# Patient Record
Sex: Male | Born: 1965 | Race: White | Hispanic: No | Marital: Married | Smoking: Never smoker
Health system: Southern US, Community
[De-identification: ages and names within clinical notes are randomized; demographics above are authoritative.]

## PROBLEM LIST (undated history)

## (undated) DIAGNOSIS — C801 Malignant (primary) neoplasm, unspecified: Secondary | ICD-10-CM

---

## 1999-03-29 ENCOUNTER — Inpatient Hospital Stay (HOSPITAL_COMMUNITY): Admission: RE | Admit: 1999-03-29 | Discharge: 1999-03-30 | Payer: Self-pay | Admitting: Otolaryngology

## 2006-02-14 ENCOUNTER — Ambulatory Visit: Payer: Self-pay | Admitting: Oncology

## 2007-10-07 IMAGING — CT CT NECK W/O CM
2 series · 10 of 14 positions shown, 12 images · IV contrast (CONTRAST)
Comparison: NONE

CLINICAL DATA: Question mass or enlarged lymph node right 
submandibular  area. 

CT SOFT TISSUE NECK WITHOUT AND WITH INTRAVENOUS CONTRAST
TECHNIQUE: Pre and post-contrast images with a BB marking the 
area.

[Series 2: without · axial · non-contrast · 0.49mm/px · z∈[-201,-76]mm · 3 of 51 slices shown]
[im 13/51  bone]
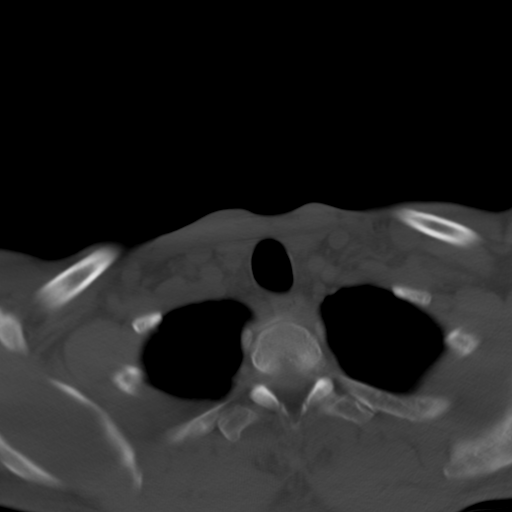
[im 26/51  bone]
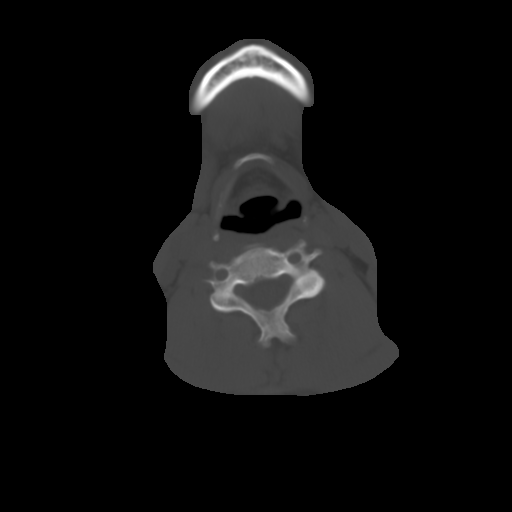
[im 38/51  bone]
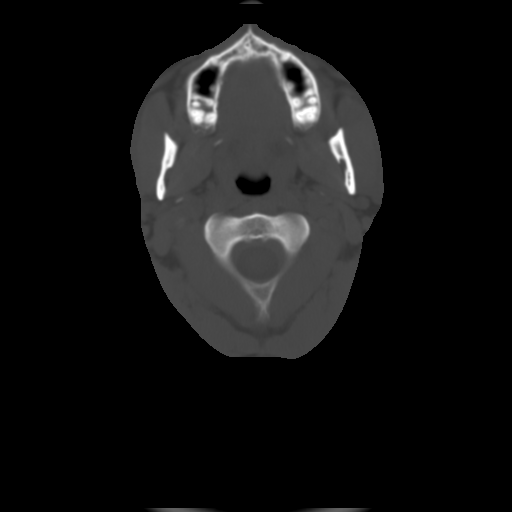

[Series 3: with contrast · axial · 0.49mm/px · z∈[-231,-42]mm · 7 of 85 slices shown, 9 images]
[im 11/85  soft-tissue]
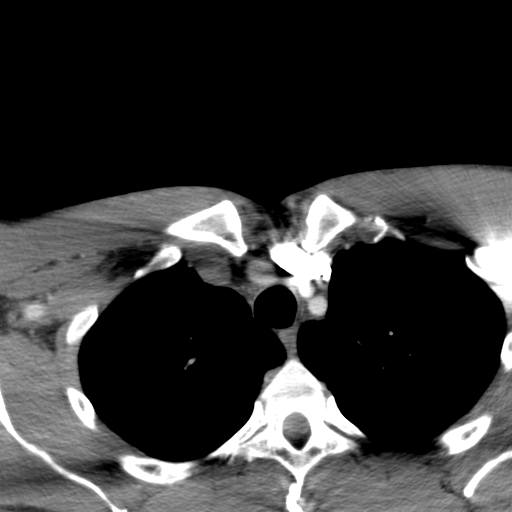
[im 11/85  bone]
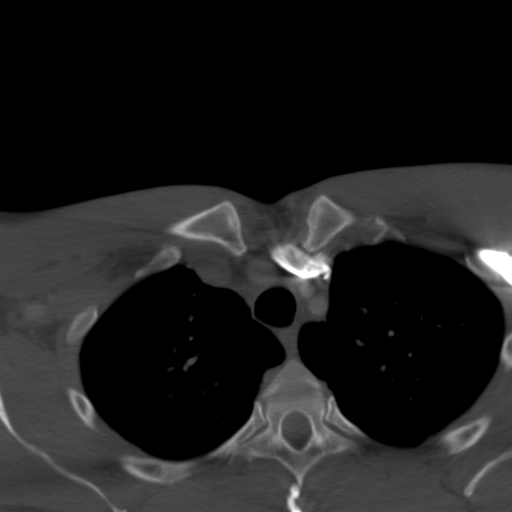
[im 22/85  bone]
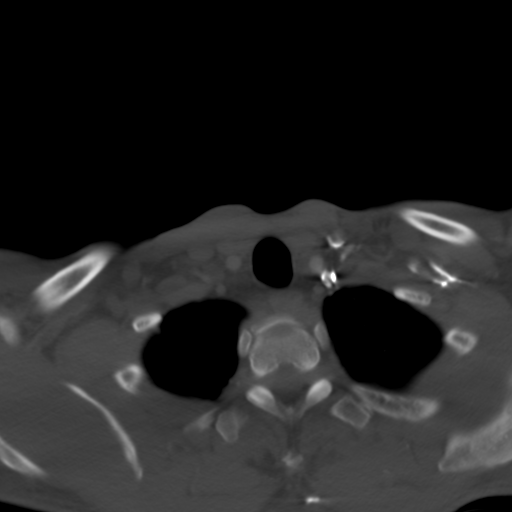
[im 32/85  bone]
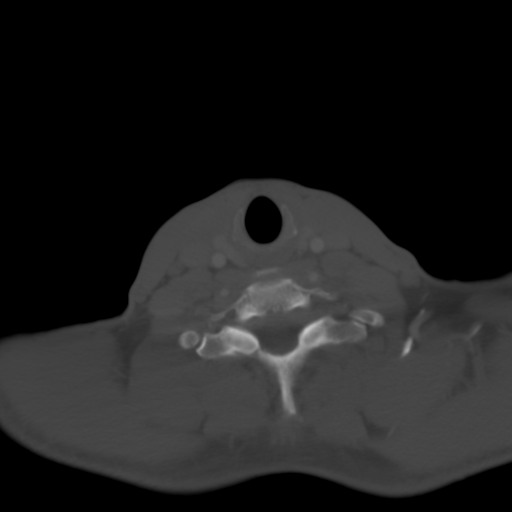
[im 43/85  bone]
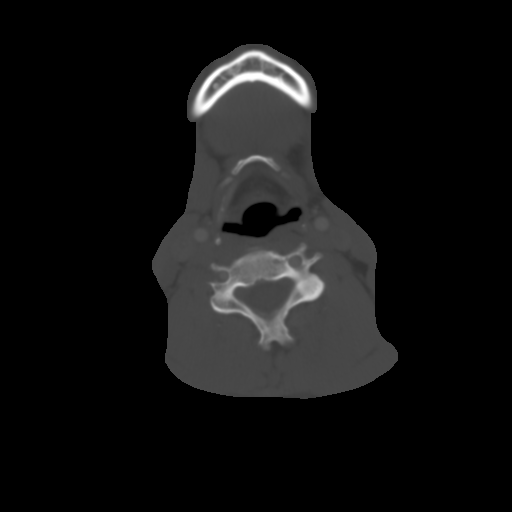
[im 53/85  soft-tissue]
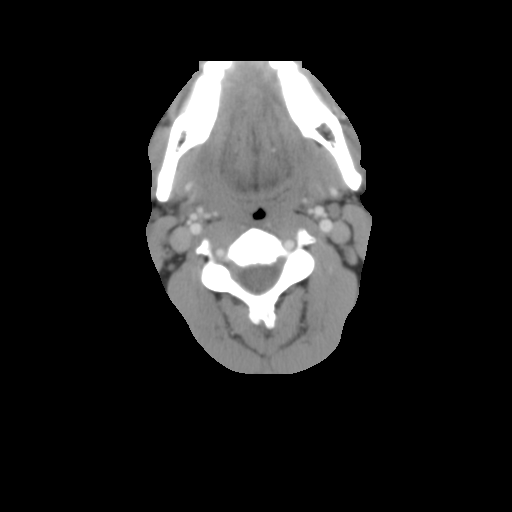
[im 53/85  bone]
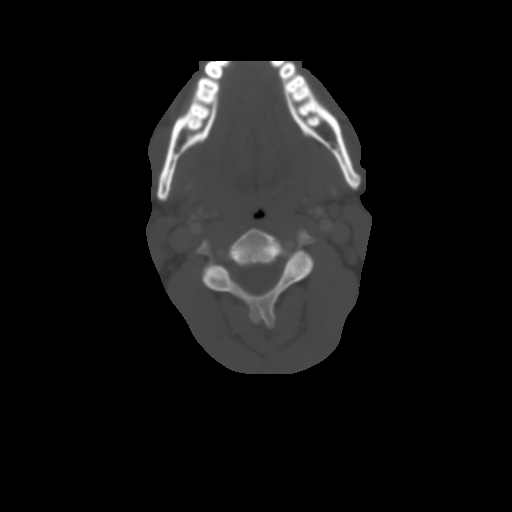
[im 64/85  bone]
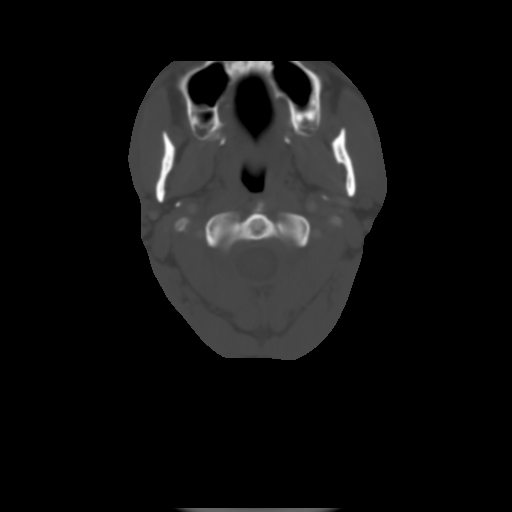
[im 74/85  bone]
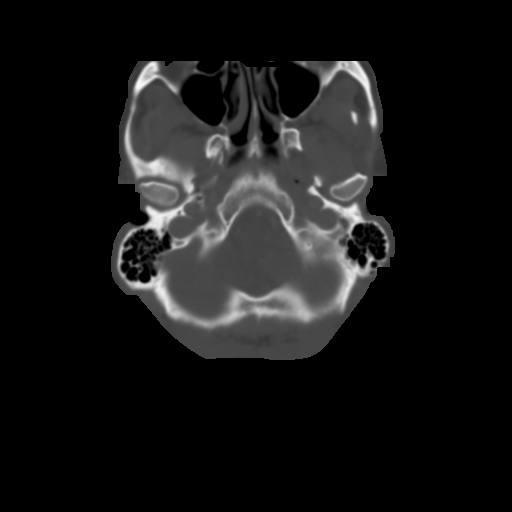

[10 of 14 positions shown; findings below may reference images not displayed]

FINDINGS: The area marked overlies the right submandibular gland. 
 I do not see a distinct mass within or adjacent to the gland, 
which is just a little larger than the opposite side.  The patient 
is rather thin, and there is very little fat separating structures 
in the neck.  No vascular abnormality is detected.  Parapharyngeal 
soft tissues, thyroid, larynx, and airway are unremarkable.
IMPRESSION: Asymmetric prominence of the right submandibular 
gland relative to the left.  No discrete mass visible by CT.  
Ultrasound or MRI may provide further differentiation if 
clinically warranted. Youllanda Aikens, M.D. Electronically 
[facility]  [facility]

## 2010-09-04 ENCOUNTER — Encounter: Payer: Self-pay | Admitting: Family Medicine

## 2010-09-04 ENCOUNTER — Ambulatory Visit
Admission: RE | Admit: 2010-09-04 | Discharge: 2010-09-04 | Disposition: A | Discharge status: Home / Self Care | Payer: BC Managed Care – PPO | Source: Ambulatory Visit | Attending: Family Medicine | Admitting: Family Medicine

## 2010-09-04 ENCOUNTER — Other Ambulatory Visit: Payer: Self-pay | Admitting: Family Medicine

## 2010-09-04 ENCOUNTER — Inpatient Hospital Stay (INDEPENDENT_AMBULATORY_CARE_PROVIDER_SITE_OTHER)
Admission: RE | Admit: 2010-09-04 | Discharge: 2010-09-04 | Disposition: A | Discharge status: Home / Self Care | Payer: BC Managed Care – PPO | Source: Ambulatory Visit | Attending: Family Medicine | Admitting: Family Medicine

## 2010-09-04 DIAGNOSIS — R059 Cough, unspecified: Secondary | ICD-10-CM

## 2010-09-04 DIAGNOSIS — R05 Cough: Secondary | ICD-10-CM

## 2010-09-04 DIAGNOSIS — E1065 Type 1 diabetes mellitus with hyperglycemia: Secondary | ICD-10-CM | POA: Insufficient documentation

## 2010-09-04 DIAGNOSIS — E109 Type 1 diabetes mellitus without complications: Secondary | ICD-10-CM | POA: Insufficient documentation

## 2010-09-04 DIAGNOSIS — J189 Pneumonia, unspecified organism: Secondary | ICD-10-CM

## 2010-09-06 ENCOUNTER — Telehealth (INDEPENDENT_AMBULATORY_CARE_PROVIDER_SITE_OTHER): Payer: Self-pay | Admitting: Emergency Medicine

## 2011-01-10 ENCOUNTER — Inpatient Hospital Stay (INDEPENDENT_AMBULATORY_CARE_PROVIDER_SITE_OTHER)
Admission: RE | Admit: 2011-01-10 | Discharge: 2011-01-10 | Disposition: A | Discharge status: Home / Self Care | Payer: BC Managed Care – PPO | Source: Ambulatory Visit | Attending: Family Medicine | Admitting: Family Medicine

## 2011-01-10 ENCOUNTER — Encounter: Payer: Self-pay | Admitting: Family Medicine

## 2011-01-10 DIAGNOSIS — L259 Unspecified contact dermatitis, unspecified cause: Secondary | ICD-10-CM

## 2011-03-26 NOTE — Letter (Signed)
Summary: Out of Work  [facility]  [address]   [place], [place] [address]   Phone: [phone]  Fax: [phone]    Sep 04, 2010   Employee:  JWAN HORNBAKER    To Whom It May Concern:   For Medical reasons, please excuse the above named employee from work today and tomorrow.   If you need additional information, please feel free to contact our office.         Sincerely,    Donna Christen MD

## 2011-03-26 NOTE — Progress Notes (Signed)
Summary: SEVERE COUGH/FEVER/FATIGUE rm 5   Vital Signs:  Patient Profile:   45 Years Old Male CC:      cough and fatigue x 1wk Height:     72 inches Weight:      183.25 pounds O2 Sat:      98 % O2 treatment:    Room Air Temp:     99.2 degrees F oral Pulse rate:   106 / minute Resp:     18 per minute BP sitting:   120 / 82  (left arm) Cuff size:   regular  Vitals Entered By: Clemens Catholic LPN (Sep 04, 2010 10:49 AM)                  Updated Prior Medication List: HUMALOG 100 UNIT/ML SOLN (INSULIN LISPRO (HUMAN))  insulin pump  Current Allergies: No known allergies History of Present Illness Chief Complaint: cough and fatigue x 1wk History of Present Illness:  Subjective:  Patient has diabetes and a past history of non-hodgkins lymphoma.  He has been fatigued over the past several weeks and visited his oncologist 1.5 weeks ago where a repeat CBC was stable (WBC 6.1, platelets 140, Hgb normal).  Over the past week he has developed a non-productive cough, worse at night.  He has had no sore throat and minimal sinus congestion.  He has had a persistent low grade fever every afternoon to about 100.6.  He sometimes coughs until he gags.  No pleuritic pain or shortness of breath.  No GI or GU symptoms.  He is not sure about his last tetanus shot.  REVIEW OF SYSTEMS Constitutional Symptoms       Complains of fever, chills, night sweats, and fatigue.     Denies weight loss and weight gain.  Eyes       Denies change in vision, eye pain, eye discharge, glasses, contact lenses, and eye surgery. Ear/Nose/Throat/Mouth       Denies hearing loss/aids, change in hearing, ear pain, ear discharge, dizziness, frequent runny nose, frequent nose bleeds, sinus problems, sore throat, hoarseness, and tooth pain or bleeding.  Respiratory       Complains of dry cough.      Denies productive cough, wheezing, shortness of breath, asthma, bronchitis, and emphysema/COPD.  Cardiovascular       Denies  murmurs, chest pain, and tires easily with exhertion.    Gastrointestinal       Denies stomach pain, nausea/vomiting, diarrhea, constipation, blood in bowel movements, and indigestion. Genitourniary       Denies painful urination, kidney stones, and loss of urinary control. Neurological       Denies paralysis, seizures, and fainting/blackouts. Musculoskeletal       Denies muscle pain, joint pain, joint stiffness, decreased range of motion, redness, swelling, muscle weakness, and gout.  Skin       Denies bruising, unusual mles/lumps or sores, and hair/skin or nail changes.  Psych       Denies mood changes, temper/anger issues, anxiety/stress, speech problems, depression, and sleep problems. Other Comments: pt c/o fatigue, dry cough and fever (in the PM, 99.5-100.6) x 1wk. he has taken Nyquil, codiene cough syrup, and tylenol which seems to help with fever and night time cough. he saw his oncologist when the fatigue started and had a CBC drawn which according to him was normal.    Past History:  Past Medical History: non-hodgkins lymphoma 2007 Diabetes mellitus, type I (insulin pump)  Past Surgical History: lumpectomy in his neck  2007  Family History: none  Social History: Never Smoked Alcohol use-no Drug use-no Smoking Status:  never Drug Use:  no   Objective:  Appearance:  Patient appears healthy, stated age, and in no acute distress  Eyes:  Pupils are equal, round, and reactive to light and accomodation.  Extraocular movement is intact.  Conjunctivae are not inflamed.  Ears:  Canals normal.  Tympanic membranes normal.   Nose:  Mildly congested turbinates.  No sinus tenderness  Pharynx:  Normal  Neck:  Supple.  No adenopathy is present.  Lungs:   Few rhonchi right posterior base; no wheezes or rales.  Breath sounds are equal.  Heart:  Regular rate and rhythm without murmurs, rubs, or gallops.  Abdomen:  Nontender without masses or hepatosplenomegaly.  Bowel sounds are  present.  No CVA or flank tenderness.  Extremities:  No edema.  Pedal pulses are full and equal.  CBC:  WBC 8.3; Ly 15.0, MO 8.9, GR 76.1; Hgb 15.3; plates 629 Chest X-ray:  1.  Vague patchy parenchymal opacity in the periphery of the right mid lung suspicious for a small focus of pneumonia. 2.  Peribronchial thickening suggestive of bronchitis. Assessment New Problems: PNEUMONIA (ICD-486) COUGH (ICD-786.2) DIABETES MELLITUS, TYPE I (ICD-250.01)  ? PERTUSSIS OR ATYPICAL PNEUMONIA  Plan New Medications/Changes: BENZONATATE 200 MG CAPS (BENZONATATE) One by mouth hs as needed cough  #12 x 0, 09/04/2010, Donna Christen MD CLARITHROMYCIN 500 MG TABS (CLARITHROMYCIN) One Tab by mouth two times a day  #20 x 0, 09/04/2010, Donna Christen MD  New Orders: T-Chest x-ray, 2 views [71020] CBC w/Diff [52841-32440] Pulse Oximetry (single measurment) [94760] New Patient Level IV [99204] Planning Comments:   Begin Biaxin, expectorant daytime, cough suppressant at bedtime.  Increase fluid intake Followup with PCP in 10 to 14 days for repeat Chest X-ray.  Recommend Tdap when well.  Return for worsening symptoms   The patient and/or caregiver has been counseled thoroughly with regard to medications prescribed including dosage, schedule, interactions, rationale for use, and possible side effects and they verbalize understanding.  Diagnoses and expected course of recovery discussed and will return if not improved as expected or if the condition worsens. Patient and/or caregiver verbalized understanding.  Prescriptions: BENZONATATE 200 MG CAPS (BENZONATATE) One by mouth hs as needed cough  #12 x 0   Entered and Authorized by:   Donna Christen MD   Signed by:   Donna Christen MD on 09/04/2010   Method used:   Print then Give to Patient   RxID:   [id number] CLARITHROMYCIN 500 MG TABS (CLARITHROMYCIN) One Tab by mouth two times a day  #20 x 0   Entered and Authorized by:   Donna Christen MD   Signed by:    Donna Christen MD on 09/04/2010   Method used:   Print then Give to Patient   RxID:   [id number]   Patient Instructions: 1)  Take Mucinex  (guaifenesin) twice daily for congestion. 2)  Increase fluid intake, rest. 3)  Recommend Tdap when well. 4)  Followup with family doctor in 10 to 14 days (earlier if symptoms become worse).  Orders Added: 1)  T-Chest x-ray, 2 views [71020] 2)  CBC w/Diff [[id number]] 3)  Pulse Oximetry (single measurment) [94760] 4)  New Patient Level IV [01601]

## 2011-03-26 NOTE — Telephone Encounter (Signed)
  Phone Note Outgoing Call   Call placed by: Emilio Math,  Sep 06, 2010 7:25 PM Call placed to: Patient Summary of Call: Feeling better going back to work tomorrow

## 2011-03-26 NOTE — Progress Notes (Signed)
Summary: Bites on back Room 4   Vital Signs:  Patient Profile:   45 Years Old Male CC:      Rash, red and itchy on ankles x 4wks, back, arms, trunk 2 wks-pt went hunting 2 wks ago rash worse after Height:     72 inches (182.88 cm) Weight:      194 pounds (88.18 kg) O2 Sat:      99 % O2 treatment:    Room Air Temp:     98.4 degrees F (36.89 degrees C) oral Pulse rate:   64 / minute Pulse rhythm:   regular Resp:     16 per minute BP sitting:   120 / 77  (left arm) Cuff size:   regular  Vitals Entered By: Emilio Math (January 10, 2011 1:24 PM)                  Current Allergies: No known allergies History of Present Illness Chief Complaint: Rash, red and itchy on ankles x 4wks, back, arms, trunk 2 wks-pt went hunting 2 wks ago rash worse after History of Present Illness:  Subjective:  Patient complains of developing small pruritic bumps on his trunk and extremities about 2 weeks ago.  He feels well otherwise; no systemic symptoms.  No other family members with similar rash.  Current Meds HUMALOG 100 UNIT/ML SOLN (INSULIN LISPRO (HUMAN))  CEPHALEXIN 500 MG CAPS (CEPHALEXIN) One by mouth two times a day STROMECTOL 3 MG TABS (IVERMECTIN) Take 6 tabs by mouth as a single dose.  Take on empty stomach with plenty of water.  REVIEW OF SYSTEMS Constitutional Symptoms      Denies fever, chills, night sweats, weight loss, weight gain, and fatigue.  Eyes       Denies change in vision, eye pain, eye discharge, glasses, contact lenses, and eye surgery. Ear/Nose/Throat/Mouth       Denies hearing loss/aids, change in hearing, ear pain, ear discharge, dizziness, frequent runny nose, frequent nose bleeds, sinus problems, sore throat, hoarseness, and tooth pain or bleeding.  Respiratory       Denies dry cough, productive cough, wheezing, shortness of breath, asthma, bronchitis, and emphysema/COPD.  Cardiovascular       Denies murmurs, chest pain, and tires easily with exhertion.     Gastrointestinal       Denies stomach pain, nausea/vomiting, diarrhea, constipation, blood in bowel movements, and indigestion. Genitourniary       Denies painful urination, kidney stones, and loss of urinary control. Neurological       Denies paralysis, seizures, and fainting/blackouts. Musculoskeletal       Denies muscle pain, joint pain, joint stiffness, decreased range of motion, redness, swelling, muscle weakness, and gout.  Skin       Denies bruising, unusual mles/lumps or sores, and hair/skin or nail changes.  Psych       Denies mood changes, temper/anger issues, anxiety/stress, speech problems, depression, and sleep problems.  Past History:  Past Medical History: Reviewed history from 09/04/2010 and no changes required. non-hodgkins lymphoma 2007 Diabetes mellitus, type I (insulin pump)  Past Surgical History: Reviewed history from 09/04/2010 and no changes required. lumpectomy in his neck 2007  Family History: Reviewed history from 09/04/2010 and no changes required. 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  No pustules or vesicles.  On the left medial thigh are several small lesions in a linear pattern.  No lesions on palms  Assessment New Problems: DERMATITIS (ICD-692.9)  SUSPECT FOLLICULITIS.  MUST ALSO CONSIDER SCABIES  Plan New Medications/Changes: STROMECTOL 3 MG TABS (IVERMECTIN) Take 6 tabs by mouth as a single dose.  Take on empty stomach with plenty of water.  #6 x 1, 01/10/2011, Donna Christen MD CEPHALEXIN 500 MG CAPS (CEPHALEXIN) One by mouth two times a day  #14 x 0, 01/10/2011, Donna Christen MD  New Orders: Est. Patient Level III [phone] Planning Comments:   Patient wishes to avoid topical treatment for  scabies. Begin Keflex 500mg  two times a day.  If not improved one week, begin Ivermectin (one dose, may repeat in 2 to 4 weeks).   If not improving 3 to 4 weeks, follow-up with dermatologist.                    The patient and/or caregiver has been counseled thoroughly with regard to medications prescribed including dosage, schedule, interactions, rationale for use, and possible side effects and they verbalize understanding.  Diagnoses and expected course of recovery discussed and will return if not improved as expected or if the condition worsens. Patient and/or caregiver verbalized understanding.  Prescriptions: STROMECTOL 3 MG TABS (IVERMECTIN) Take 6 tabs by mouth as a single dose.  Take on empty stomach with plenty of water.  #6 x 1   Entered and Authorized by:   Donna Christen MD   Signed by:   Donna Christen MD on 01/10/2011   Method used:   Print then Give to Patient   RxID:   [id number] CEPHALEXIN 500 MG CAPS (CEPHALEXIN) One by mouth two times a day  #14 x 0   Entered and Authorized by:   Donna Christen MD   Signed by:   Donna Christen MD on 01/10/2011   Method used:   Print then Give to Patient   RxID:   [id number]   Orders Added: 1)  Est. Patient Level III [01027]

## 2011-12-31 ENCOUNTER — Encounter: Payer: Self-pay | Admitting: Emergency Medicine

## 2011-12-31 ENCOUNTER — Emergency Department
Admission: EM | Admit: 2011-12-31 | Discharge: 2011-12-31 | Disposition: A | Discharge status: Home / Self Care | Payer: BC Managed Care – PPO | Source: Home / Self Care

## 2011-12-31 DIAGNOSIS — R21 Rash and other nonspecific skin eruption: Secondary | ICD-10-CM

## 2011-12-31 HISTORY — DX: Malignant (primary) neoplasm, unspecified: C80.1

## 2011-12-31 LAB — POCT CBC W AUTO DIFF (K'VILLE URGENT CARE)

## 2011-12-31 MED ORDER — DOXYCYCLINE HYCLATE 100 MG PO CAPS: 100.0000 mg | ORAL_CAPSULE | Freq: Two times a day (BID) | ORAL | Status: AC

## 2011-12-31 NOTE — ED Notes (Signed)
Rash, red itchy bumps on legs, trunk, arms.  Clearing land 2 weeks ago had a tick bite on left leg, fever about one week later, 3 days ago little red, itchy bumps popped up. Had same thing about one year ago.

## 2011-12-31 NOTE — ED Provider Notes (Signed)
History     CSN: [id number]  Arrival date & time 12/31/11  1051   First MD Initiated Contact with Patient 12/31/11 1119      Chief Complaint  Patient presents with  . Rash   HPI HPI  This patient complains of a RASH  Location: upper and lower extremities, trunk  Onset: 3-4 days  Course: Pt went hunting in [place], had multiple insect and tick bites. Patient states that he's had persistent skin irritation since this point. Patient also reports having a tick bite approximately 2 weeks ago. Patient states he's had intermittent fevers chills and malaise since this point. Patient states he was previously seen for a very similar rash at this time one year ago. Patient was placed on Keflex for this with resolution of symptoms. Patient also had a prescription of doxycycline that was given to him by dermatologist. Patient states he's been using this medication for the past couple of days. Patient is unsure if medication has been working because is been in a very hot place over the summer. Patient also has a baseline history of insulin-dependent diabetes. Patient states A1c's have been in the sixes. Patient also has a history of non-Hodgkin's lymphoma that was treated with chemotherapy approximately 6 years ago. Patient is currently in remission.   Self-treated with: old doxycyline prescription   Improvement with treatment: unsure   History  Itching: yes  Tenderness: no  New medications/antibiotics: yes; doxy at onset of itching   Pet exposure: no  Recent travel or tropical exposure: yes  New soaps, shampoos, detergent, clothing: no  Tick/insect exposure: yes  Red Flags  Feeling ill: mild; now resolved per pt.   Fever: mild at home   Facial/tongue swelling/difficulty breathing: no  Diabetic or immunocompromised: yes     Past Medical History  Diagnosis Date  . Diabetes mellitus   . Cancer     History reviewed. No pertinent past surgical history.  No family history on  file.  History  Substance Use Topics  . Smoking status: Never Smoker   . Smokeless tobacco: Not on file  . Alcohol Use: No      Review of Systems  All other systems reviewed and are negative.    Allergies  Review of patient's allergies indicates no known allergies.  Home Medications   Current Outpatient Rx  Name Route Sig Dispense Refill  . ACETAMINOPHEN 325 MG PO TABS Oral Take 650 mg by mouth every 6 (six) hours as needed.    Marland Kitchen FOLIC ACID 1 MG PO TABS Oral Take 1 mg by mouth daily.    . INSULIN ASPART 100 UNIT/ML [place] SOLN Subcutaneous Inject into the skin 3 (three) times daily before meals.      BP 116/78  Pulse 67  Temp 98 F (36.7 C) (Oral)  Resp 18  Ht 6' (1.829 m)  Wt 192 lb (87.091 kg)  BMI 26.04 kg/m2  SpO2 99%  Physical Exam  Constitutional: He appears well-developed and well-nourished.  HENT:  Head: Normocephalic and atraumatic.  Right Ear: External ear normal.  Left Ear: External ear normal.  Eyes: Conjunctivae are normal. Pupils are equal, round, and reactive to light.  Neck: Normal range of motion. Neck supple.  Cardiovascular: Normal rate and regular rhythm.   Pulmonary/Chest: Effort normal and breath sounds normal.  Abdominal: Soft. Bowel sounds are normal.  Musculoskeletal: Normal range of motion.  Lymphadenopathy:    He has no cervical adenopathy.  Skin:  ED Course  Procedures (including critical care time)  Labs Reviewed - No data to display No results found.   1. Rash       MDM  Suspect this may be a recurrence of patient's last flare of folliculitis. However, given underlying diabetes and tick exposure we'll cover with doxycycline. Will check CBC, RMSF titers. Restart antibacterial soap. Given rash distribution, scabies is also on ddx. If sxs persist despite treatment, will start on ivermectin.  Discuss infectious red flags with patient for reevaluation including fevers, chills, worsening rash. Otherwise plan for  followup in one week.    The patient and/or caregiver has been counseled thoroughly with regard to treatment plan and/or medications prescribed including dosage, schedule, interactions, rationale for use, and possible side effects and they verbalize understanding. Diagnoses and expected course of recovery discussed and will return if not improved as expected or if the condition worsens. Patient and/or caregiver verbalized understanding.             Doree Albee, MD 12/31/11 1154

## 2012-01-02 LAB — ROCKY MTN SPOTTED FVR AB, IGG-BLOOD: RMSF IgG: 0.25 IV

## 2012-07-21 IMAGING — CR DG CHEST 2V
2 series · 2 of 2 positions shown · non-contrast
Comparison: None.

CLINICAL DATA: Persistent cough and fever

CHEST - 2 VIEW

[view not recorded (1 of 2)]
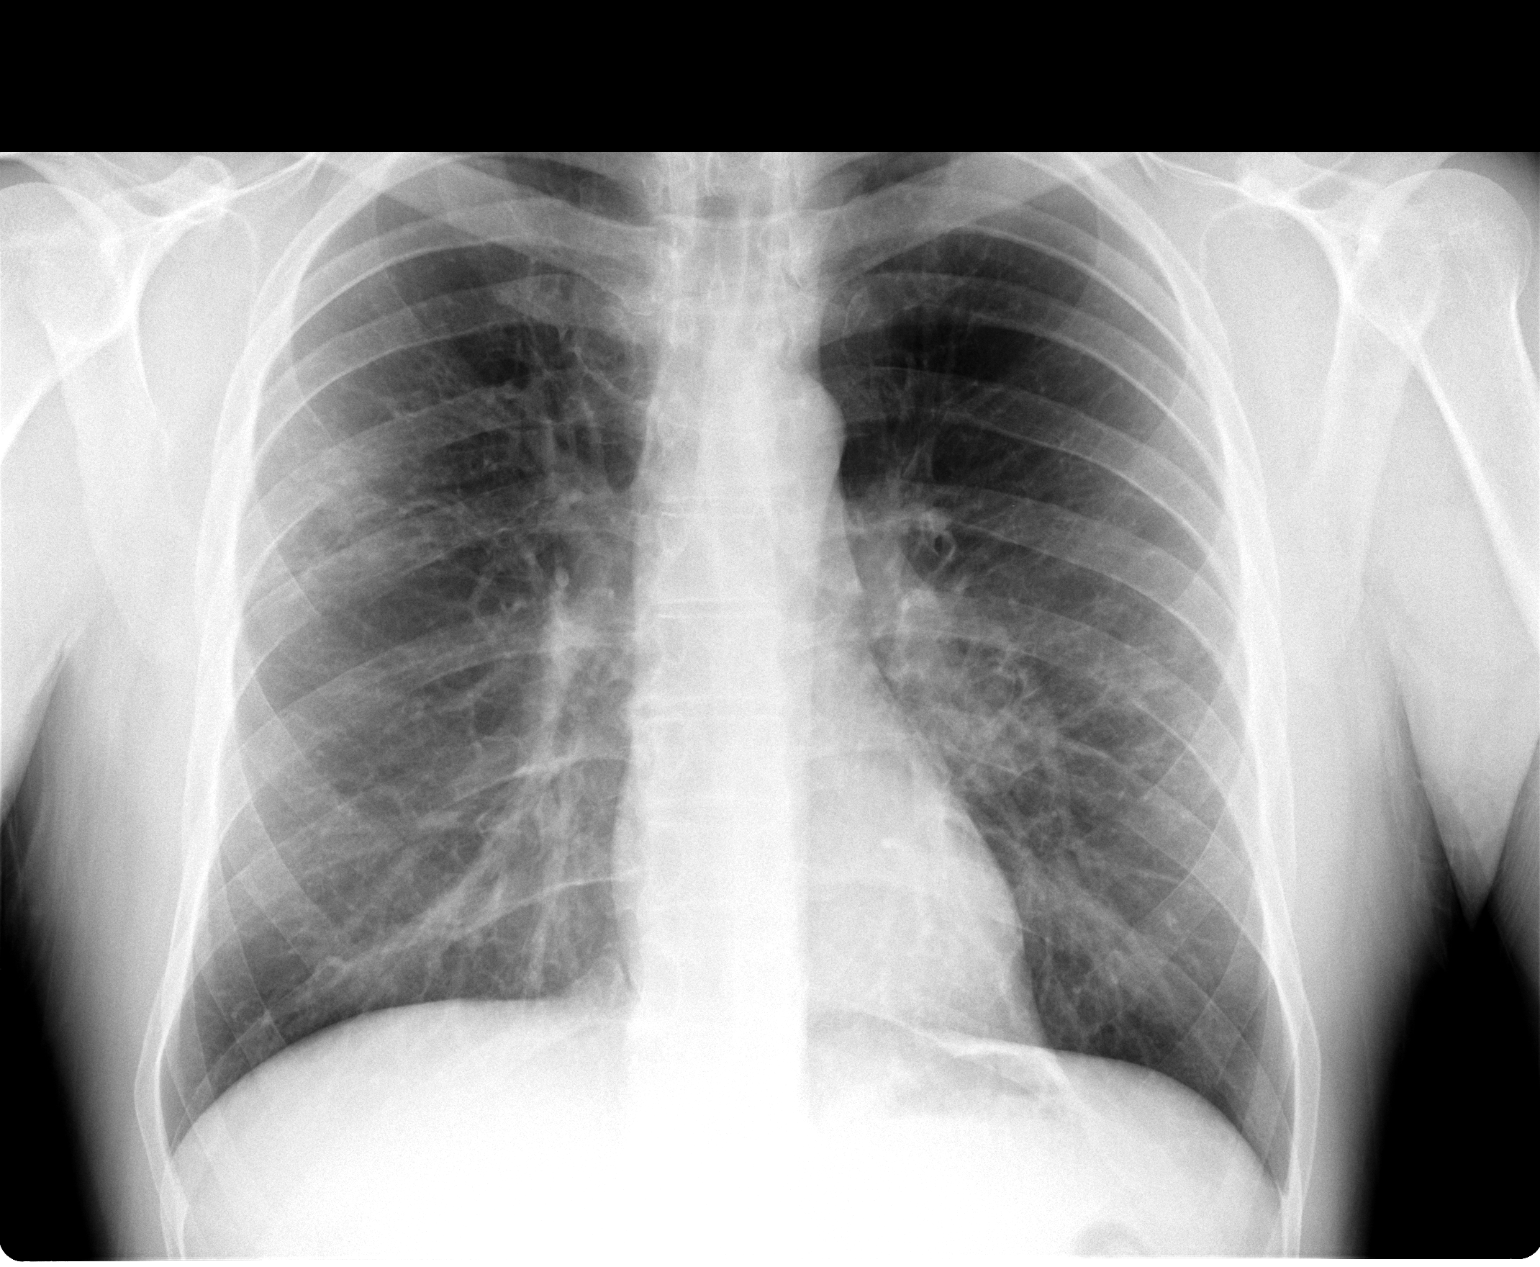

[view not recorded (2 of 2)]
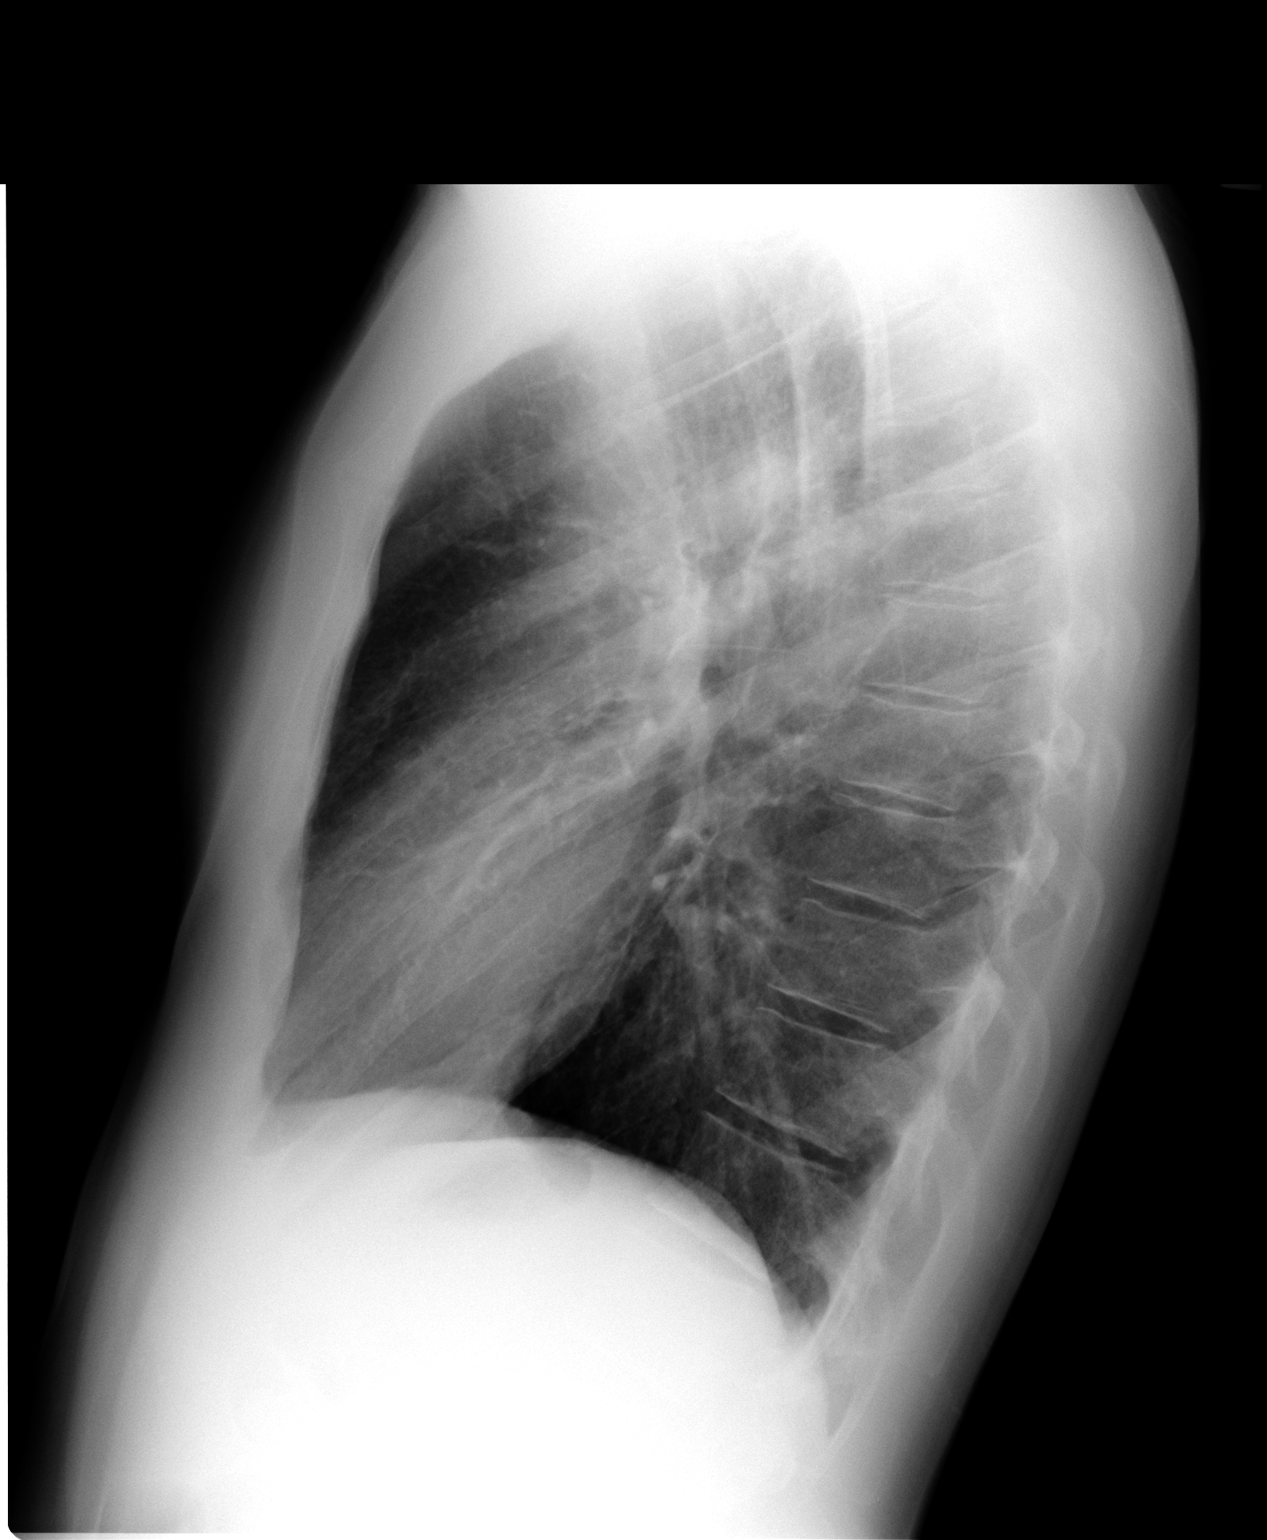

[2 of 2 positions shown; findings below may reference images not displayed]

FINDINGS: There is minimal patchy opacity in the periphery of the
right mid lung field probably within the right upper lobe
suspicious for a small focus of pneumonia.  Mild peribronchial
thickening is noted which may indicate bronchitis as well.
Mediastinal contours appear stable.  The heart is within normal
limits in size.  No bony abnormality is seen.
IMPRESSION: 1.  Vague patchy parenchymal opacity in the periphery of the right
mid lung suspicious for a small focus of pneumonia.
2.  Peribronchial thickening suggestive of bronchitis.

## 2012-10-27 ENCOUNTER — Other Ambulatory Visit: Payer: Self-pay | Admitting: Endocrinology

## 2012-10-27 DIAGNOSIS — C8589 Other specified types of non-Hodgkin lymphoma, extranodal and solid organ sites: Secondary | ICD-10-CM

## 2012-10-27 DIAGNOSIS — E1065 Type 1 diabetes mellitus with hyperglycemia: Secondary | ICD-10-CM

## 2012-11-04 ENCOUNTER — Encounter: Payer: Self-pay | Admitting: Endocrinology

## 2012-11-04 ENCOUNTER — Ambulatory Visit (INDEPENDENT_AMBULATORY_CARE_PROVIDER_SITE_OTHER): Payer: BC Managed Care – PPO | Admitting: Endocrinology

## 2012-11-04 VITALS — BP 126/68 | HR 69 | Temp 97.9°F | Resp 12 | Ht 73.5 in | Wt 194.2 lb

## 2012-11-04 DIAGNOSIS — E109 Type 1 diabetes mellitus without complications: Secondary | ICD-10-CM

## 2012-11-04 NOTE — Patient Instructions (Addendum)
Dual wave bolus for high fat meals with extra units also Get Bayer meter  To change basal rates: 1.0 from 11 PM-2 AM and 0.9 from 2 AM-5 AM

## 2012-11-04 NOTE — Progress Notes (Signed)
Patient ID: Corey Hudson, male   DOB: 05/07/1965, 47 y.o.   MRN: [id number]  Insulin Pump followup:   CURRENT brand:  Medtronic  HISTORY: He has a long-standing type 1 diabetes which is well-controlled  The pump SETTINGS are: Basal rate: 0.9 until 2 AM, 2 AM = 0.8, 11 AM = 0.7, 4 PM = 0.65, 8 PM = 0.80 and 11 PM = 0.90  Carb Ratio: 1: 8 of breakfast and then 1:12 Correction factor 1: 40 with target 90-110 and active insulin 5 hours  GLUCOSE CONTROL with the pump is assessed today by download.   His blood sugars are mostly near-normal during the day but fasting average 109, lunchtime average 94 and suppertime average 108. Recent readings not available because of pump malfunction but otherwise fasting range has been 79-132, lunchtime as low as 70 and suppertime range 70-166. However overnight average blood sugar is about 200, highest 248 at about 4 AM. Overall average glucose 131 Blood sugar analysis limited by the fact that he is not entering on his glucose readings in his pump from his One Touch meter, this does not link to his pump He is doing 48-month-old boluses and about 4 bolus wizard events per day with 79% using correction and rewind about every 4 days  HYPERGLYCEMIA Occuring at about 2 AM and fairly consistently after midnight until 5-6 AM. Only rarely has a high reading in the afternoon or evening, usually below 200  HYPOGLYCEMIC episodes are minimal but he does not always record his low readings on his pump. No severe low episodes and able to prevent low sugars with physical activity by getting the temporary basal to a low basal rate  A1c on 10/27/12 = 5.8, previous values not available  Compared to the last visit the diabetes is about the same although higher blood sugars at night and less hypoglycemia evident Because of his insulin pump malfunction about 2 days ago he started using Lantus 18 units at night, his blood sugar has been somewhat variable, higher this morning because of  eating pizza  EXERCISE: He is usually active on weekends with various outdoor activities in the summer  MICROALBUMIN has been tested, and the result is normal .     Medication List       This list is accurate as of: 11/04/12 11:59 AM.  Always use your most recent med list.               acetaminophen 325 MG tablet  Commonly known as:  TYLENOL  Take 650 mg by mouth every 6 (six) hours as needed.     folic acid 1 MG tablet  Commonly known as:  FOLVITE  Take 1 mg by mouth daily.     insulin lispro 100 UNIT/ML injection  Commonly known as:  HUMALOG  Inject into the skin 3 (three) times daily before meals.        Allergies: No Known Allergies  Past Medical History  Diagnosis Date  . Diabetes mellitus   . Cancer     No past surgical history on file.  No family history on file.    Social History:  reports that he has never smoked. He does not have any smokeless tobacco history on file. He reports that he does not drink alcohol. His drug history is not on file.  REVIEW of systems:  No history of hypertension: No history of hypothyroidism HYPERLIPIDEMIA: His LDL is 111 with HDL 53 as of 10/27/12  ASSESSMENT:  Type 1 diabetes, well controlled  Problems identified:  Not entering in all his blood sugars in the pump since he does not link his meter, discussed getting the Bayer meter from the accompany to help link his blood sugars to the pump   Nocturnal hyperglycemia  is evident on his pump download. Discussed that this is related to inadequate basal rather than late-night snacks And basal rates would be increased overnight until 5 AM  Not doing any dual wave boluses or extra insulin for higher fat meals. However he is usually eating low fat meals Occasional hypoglycemia but readings this is infrequent Very active lifestyle and he had a problem with pump malfunction possibly from water exposure. He would like to use Lantus occasionally on weekends instead of the pump  and discussed how to do this with about 18 units; may reduce the dosage he is planning to be active. He can do the boluses with the NovoLog using the syringe that he has used Review download on next visit. A1c is normal and  would be concerned about potentially excessive hypoglycemia causing the lower value; will change his target to 100-110 instead of 90-100  His new pump was programmed by myself for the original settings along with new basal rate changes which would be 1.0 from 11 PM-2 AM and 0.9 from 2 AM-5 AM, he is not able to program basal rates and bolus as on his own Copies of previous settings also given  Counseling time over 50% of today's 25 minute visit

## 2013-02-04 ENCOUNTER — Ambulatory Visit: Payer: BC Managed Care – PPO | Admitting: Endocrinology

## 2013-03-25 ENCOUNTER — Other Ambulatory Visit (INDEPENDENT_AMBULATORY_CARE_PROVIDER_SITE_OTHER): Payer: BC Managed Care – PPO

## 2013-03-25 DIAGNOSIS — E109 Type 1 diabetes mellitus without complications: Secondary | ICD-10-CM

## 2013-03-25 DIAGNOSIS — IMO0002 Reserved for concepts with insufficient information to code with codable children: Secondary | ICD-10-CM

## 2013-03-25 DIAGNOSIS — C8589 Other specified types of non-Hodgkin lymphoma, extranodal and solid organ sites: Secondary | ICD-10-CM

## 2013-03-25 DIAGNOSIS — E1065 Type 1 diabetes mellitus with hyperglycemia: Secondary | ICD-10-CM

## 2013-03-25 LAB — CBC WITH DIFFERENTIAL/PLATELET
Basophils Relative: 1 % (ref 0.0–3.0)
HCT: 44.2 % (ref 39.0–52.0)
Hemoglobin: 14.6 g/dL (ref 13.0–17.0)
Lymphocytes Relative: 18.1 % (ref 12.0–46.0)
Monocytes Relative: 10.8 % (ref 3.0–12.0)
Neutro Abs: 4.5 10*3/uL (ref 1.4–7.7)
RBC: 5.02 Mil/uL (ref 4.22–5.81)

## 2013-03-25 LAB — BASIC METABOLIC PANEL
CO2: 31 mEq/L (ref 19–32)
Calcium: 9.3 mg/dL (ref 8.4–10.5)
GFR: 87.75 mL/min (ref >60.00)
Potassium: 4.4 mEq/L (ref 3.5–5.1)
Sodium: 137 mEq/L (ref 135–145)

## 2013-03-25 LAB — URINALYSIS, ROUTINE W REFLEX MICROSCOPIC
Leukocytes, UA: NEGATIVE
Nitrite: NEGATIVE
RBC / HPF: NONE SEEN (ref 0–?)
Specific Gravity, Urine: 1.015 (ref 1.000–1.030)
pH: 8 (ref 5.0–8.0)

## 2013-03-25 LAB — HEMOGLOBIN A1C: Hgb A1c MFr Bld: 6.6 % — ABNORMAL HIGH (ref 4.6–6.5)

## 2013-03-25 LAB — MICROALBUMIN / CREATININE URINE RATIO: Microalb Creat Ratio: 0.2 mg/g (ref 0.0–30.0)

## 2013-03-27 ENCOUNTER — Ambulatory Visit (INDEPENDENT_AMBULATORY_CARE_PROVIDER_SITE_OTHER): Payer: BC Managed Care – PPO | Admitting: Endocrinology

## 2013-03-27 ENCOUNTER — Encounter: Payer: Self-pay | Admitting: Endocrinology

## 2013-03-27 VITALS — BP 112/68 | HR 71 | Temp 98.2°F | Resp 12 | Ht 73.0 in | Wt 197.4 lb

## 2013-03-27 DIAGNOSIS — IMO0002 Reserved for concepts with insufficient information to code with codable children: Secondary | ICD-10-CM

## 2013-03-27 DIAGNOSIS — E1065 Type 1 diabetes mellitus with hyperglycemia: Secondary | ICD-10-CM

## 2013-03-27 DIAGNOSIS — D696 Thrombocytopenia, unspecified: Secondary | ICD-10-CM

## 2013-03-27 DIAGNOSIS — E7800 Pure hypercholesterolemia, unspecified: Secondary | ICD-10-CM

## 2013-03-27 NOTE — Progress Notes (Signed)
Patient ID: Corey Hudson, male   DOB: [birth year], 47 y.o.   MRN: [id number]  Insulin Pump followup:   CURRENT brand:  Medtronic  HISTORY: He has a long-standing type 1 diabetes which is usually well-controlled  The pump SETTINGS are: Basal rate: 0.9 until 2 AM, 2 AM = 0.8, 11 AM = 0.7, 4 PM = 0.65, 8 PM = 0.80 and 11 PM = 0.90   Carb Ratio: 1: 8 at breakfast and then 1:12 Correction factor 1:40 with target 100 -110 and active insulin 7 hours  Prior history: On his last visit he was having the following problems:  Nocturnal hyperglycemia related to inadequate basal rather than late-night snacks; his basal rates were increased overnight until 5 AM  Not doing any dual wave boluses or extra insulin for higher fat meals causing some hyperglycemia also   GLUCOSE CONTROL with the pump is assessed today by download.  Following patterns are present  Marked hyperglycemia late at night several hours after his evening meal he will get up at night and doing correction boluses. He thinks this is from less activity in the evenings compared to the summer time  Low normal blood sugars are less than. He reports some tendency to hypoglycemia including today after breakfast.  He has low normal readings at times around supper time on the days he is more active  Fairly good fasting blood sugars  Somewhat variable blood sugars at suppertime  Postprandial readings are relatively lower after breakfast and higher after supper; has lower carbohydrate coverage at lunch and supper  Blood sugar analysis limited by the fact that he is not entering all his glucose readings in his pump from his One Touch meter. He has just started using the linking meter MEAN glucose on the download is 138, standard deviation 63 with 38% of readings above target Highest blood sugars are around midnight with average over 200 and lowest blood sugars at noon with average 91 FASTING glucose average about 121 He is doing 11 manual  boluses and about 4 bolus wizard events per day. 87 % boluses are using correction and overriding 20% of the time. rewind about every 3-4 days  Compared to the last visit the diabetes is  relatively worse as judged by his A1c  EXERCISE: He is usually active on weekends and during the day with hunting  A1c on 10/27/12 = 5.8  Lab Results  Component Value Date   HGBA1C 6.6* 03/25/2013   Lab Results  Component Value Date   MICROALBUR 0.2 03/25/2013   CREATININE 1.0 03/25/2013       Medication List       This list is accurate as of: 03/27/13 11:18 AM.  Always use your most recent med list.               acetaminophen 325 MG tablet  Commonly known as:  TYLENOL  Take 650 mg by mouth every 6 (six) hours as needed.     folic acid 1 MG tablet  Commonly known as:  FOLVITE  Take 1 mg by mouth daily.     insulin lispro 100 UNIT/ML injection  Commonly known as:  HUMALOG  Inject into the skin 3 (three) times daily before meals. Uses max 40 units per day        Allergies: No Known Allergies  Past Medical History  Diagnosis Date  . Diabetes mellitus   . Cancer     No past surgical history on file.  No family  history on file.  Social History:  reports that he has never smoked. He does not have any smokeless tobacco history on file. He reports that he does not drink alcohol. His drug history is not on file.  REVIEW of systems:  No history of hypertension: Prior history of hematological malignancy, has fairly normal counts including platelets which are low normal  HYPERLIPIDEMIA: His LDL is 111 with HDL 53 as of 10/27/12 and will need repeat testing   ASSESSMENT:  Type 1 diabetes,  overall well controlled with A1c 6.6% This is somewhat higher than his historically normal values  Problems identified are described in detail in history of present illness Most of his hyperglycemia his late at night as he is less active in the evenings Also appears to inadequate coverage of  evening meal with boluses as well as relatively low readings after breakfast boluses Is quite motivated to check his blood sugar frequently and do corrections as needed However has not programmed his basal rates according to recent trend of hyperglycemia as above  His basal rates will be increased at midnight to 1.1, 1.0 at 8 PM until midnight Carbohydrate coverage 1: 10 at breakfast and supper, 1:12 at lunch Changes were programmed in the office today on his pump He will also start using the Bayer glucose monitor that will link to the pump  Total visit time including counseling = 25 minutes  Sundance Moise

## 2013-03-27 NOTE — Patient Instructions (Addendum)
Call if blood sugar not improved. Keep a copy of the new settings that were programmed today May reduce basal rate late evening when starting to be more active after supper

## 2013-03-28 DIAGNOSIS — E7800 Pure hypercholesterolemia, unspecified: Secondary | ICD-10-CM | POA: Insufficient documentation

## 2013-05-18 ENCOUNTER — Other Ambulatory Visit: Payer: Self-pay | Admitting: *Deleted

## 2013-05-18 MED ORDER — ONETOUCH ULTRASOFT LANCETS MISC: Status: DC

## 2013-06-22 ENCOUNTER — Other Ambulatory Visit: Payer: BC Managed Care – PPO

## 2013-06-22 ENCOUNTER — Ambulatory Visit: Payer: BC Managed Care – PPO

## 2013-06-24 ENCOUNTER — Other Ambulatory Visit (INDEPENDENT_AMBULATORY_CARE_PROVIDER_SITE_OTHER): Payer: BC Managed Care – PPO

## 2013-06-24 DIAGNOSIS — E7800 Pure hypercholesterolemia, unspecified: Secondary | ICD-10-CM

## 2013-06-24 DIAGNOSIS — IMO0002 Reserved for concepts with insufficient information to code with codable children: Secondary | ICD-10-CM

## 2013-06-24 DIAGNOSIS — E1065 Type 1 diabetes mellitus with hyperglycemia: Secondary | ICD-10-CM

## 2013-06-24 LAB — COMPREHENSIVE METABOLIC PANEL
ALBUMIN: 4.2 g/dL (ref 3.5–5.2)
ALT: 18 U/L (ref 0–53)
AST: 22 U/L (ref 0–37)
Alkaline Phosphatase: 51 U/L (ref 39–117)
BUN: 16 mg/dL (ref 6–23)
CO2: 30 meq/L (ref 19–32)
Calcium: 9.5 mg/dL (ref 8.4–10.5)
Chloride: 100 mEq/L (ref 96–112)
Creatinine, Ser: 1 mg/dL (ref 0.4–1.5)
GFR: 81.79 mL/min (ref >60.00)
GLUCOSE: 126 mg/dL — AB (ref 70–99)
POTASSIUM: 4 meq/L (ref 3.5–5.1)
SODIUM: 137 meq/L (ref 135–145)
TOTAL PROTEIN: 6.5 g/dL (ref 6.0–8.3)
Total Bilirubin: 1.1 mg/dL (ref 0.3–1.2)

## 2013-06-24 LAB — LIPID PANEL
CHOL/HDL RATIO: 3
CHOLESTEROL: 161 mg/dL (ref 0–200)
HDL: 61.7 mg/dL (ref >39.00)
LDL CALC: 88 mg/dL (ref 0–99)
TRIGLYCERIDES: 57 mg/dL (ref 0.0–149.0)
VLDL: 11.4 mg/dL (ref 0.0–40.0)

## 2013-06-24 LAB — MICROALBUMIN / CREATININE URINE RATIO
Creatinine,U: 172.8 mg/dL
MICROALB UR: 1.3 mg/dL (ref 0.0–1.9)
Microalb Creat Ratio: 0.8 mg/g (ref 0.0–30.0)

## 2013-06-24 LAB — HEMOGLOBIN A1C: HEMOGLOBIN A1C: 6.5 % (ref 4.6–6.5)

## 2013-06-26 ENCOUNTER — Telehealth: Payer: Self-pay | Admitting: *Deleted

## 2013-06-26 ENCOUNTER — Encounter: Payer: Self-pay | Admitting: Endocrinology

## 2013-06-26 ENCOUNTER — Other Ambulatory Visit: Payer: Self-pay | Admitting: *Deleted

## 2013-06-26 ENCOUNTER — Ambulatory Visit (INDEPENDENT_AMBULATORY_CARE_PROVIDER_SITE_OTHER): Payer: BC Managed Care – PPO | Admitting: Endocrinology

## 2013-06-26 VITALS — BP 116/66 | HR 69 | Temp 98.1°F | Resp 16 | Ht 73.5 in | Wt 198.6 lb

## 2013-06-26 DIAGNOSIS — Z87898 Personal history of other specified conditions: Secondary | ICD-10-CM

## 2013-06-26 DIAGNOSIS — E1065 Type 1 diabetes mellitus with hyperglycemia: Secondary | ICD-10-CM

## 2013-06-26 DIAGNOSIS — Z8579 Personal history of other malignant neoplasms of lymphoid, hematopoietic and related tissues: Secondary | ICD-10-CM

## 2013-06-26 DIAGNOSIS — IMO0002 Reserved for concepts with insufficient information to code with codable children: Secondary | ICD-10-CM

## 2013-06-26 MED ORDER — GLUCOSE BLOOD VI STRP: ORAL_STRIP | Status: DC

## 2013-06-26 NOTE — Patient Instructions (Signed)
Extra 2-4 units with extended bolus for high fat meal

## 2013-06-26 NOTE — Progress Notes (Signed)
Patient ID: SIDDARTH HSIUNG, male   DOB: 1966-01-19, 48 y.o.   MRN: [id number]   Insulin Pump followup:   CURRENT brand:  Medtronic  HISTORY: He has a long-standing type 1 diabetes which is usually well-controlled Previously A1c readings have been as low as 5.8 although was having more hypoglycemia in the past  The pump SETTINGS are: Basal rate: 1.1 until 2 AM, 2 AM = 0.9, 11 AM = 0.7, 4 PM = 0.65, 8 PM = 0.80 and 11 PM = 0.90   Carb Ratio: 1: 8 at breakfast and then 1:12 Correction factor 1:40 with target 100 -110 and active insulin 7 hours  Prior history: On his last visit he was having the following problems:   Late evening hyperglycemia especially after evening meal and also some during the night  Relatively low readings after breakfast boluses  Low normal readings before supper  GLUCOSE CONTROL with the pump is assessed today by download.  Following patterns are present  Periodic high blood sugars after evening meal and also sporadically after lunch. He says that occasionally he will take a lower dose of bolus for his evening meal if he thinks he will be active afterwards but is not able to consistently balance these parameters  Mostly high readings when he checks them between 1 AM-4 AM over 200  Relatively lower readings during the day but without any documented hypoglycemia. Thinks he may feel hypoglycemic occasionally and will treat this. Lowest blood sugar is 70  Postprandial readings after breakfast are generally about the same as before eating  GLUCOSE meter download results:  PREMEAL Breakfast Lunch Dinner Bedtime Overall  Glucose range:  108-133   70-138   70-246   91-288    Mean/median:  113     142+/65   Overnight glucose range 216-254  He is doing 11 manual boluses and about 4 bolus wizard events per day. 87 % boluses are using correction and overriding 20% of the time. rewind about every 3-4 days  EXERCISE: He is usually active on weekends and during the day  with hunting    Lab Results  Component Value Date   HGBA1C 6.5 06/24/2013   HGBA1C 6.6* 03/25/2013   Lab Results  Component Value Date   MICROALBUR 1.3 06/24/2013   LDLCALC 88 06/24/2013   CREATININE 1.0 06/24/2013       Medication List       This list is accurate as of: 06/26/13  8:43 AM.  Always use your most recent med list.               acetaminophen 325 MG tablet  Commonly known as:  TYLENOL  Take 650 mg by mouth every 6 (six) hours as needed.     folic acid 1 MG tablet  Commonly known as:  FOLVITE  Take 1 mg by mouth daily.     insulin lispro 100 UNIT/ML injection  Commonly known as:  HUMALOG  Inject into the skin 3 (three) times daily before meals. Uses max 40 units per day     onetouch ultrasoft lancets  Use as instructed to check blood sugars 10 times per day        Allergies: No Known Allergies  Past Medical History  Diagnosis Date  . Diabetes mellitus   . Cancer     No past surgical history on file.  No family history on file.  Social History:  reports that he has never smoked. He does not have any smokeless  tobacco history on file. He reports that he does not drink alcohol. His drug history is not on file.  REVIEW of systems:  No history of hypertension: Prior history of hematological malignancy, has fairly normal counts including platelets which are low normal   HYPERLIPIDEMIA: This has been mild and not on treatment  Lab Results  Component Value Date   CHOL 161 06/24/2013   HDL 61.70 06/24/2013   LDLCALC 88 06/24/2013   TRIG 57.0 06/24/2013   CHOLHDL 3 06/24/2013     ASSESSMENT:  Type 1 diabetes,  overall well controlled with A1c 6.5% Current blood sugar patterns and problems identified are described in detail in history of present illness Most of his hyperglycemia is again late at night but not consistently Mostly high readings are probably related to  inadequate coverage of evening meal with boluses, probably when he is eating higher fat  meals Also despite increasing his basal rates overnight his blood sugars are usually high when he checks them during the night Discussed needing adequate coverage for his evening meals especially using extended wave bolus for higher fat meals   His basal rates will be increased at midnight to  1.2 and it 2 AM to 1.0; change basal rate to 1.1 at 11 PM   Carbohydrate coverage 1: 10 at breakfast 1:12 at lunch and 1:8 at supper He will need to possibly reduce his basal rate or evening carbohydrate coverage when he starts getting more active in the evenings  He will also start using the [employer]ayer glucose monitor that will link to the pump  Total visit time including counseling = 25 minutes  Elzena Muston

## 2013-08-21 ENCOUNTER — Telehealth: Payer: Self-pay | Admitting: Endocrinology

## 2013-08-21 ENCOUNTER — Other Ambulatory Visit: Payer: Self-pay | Admitting: *Deleted

## 2013-08-21 MED ORDER — INSULIN LISPRO 100 UNIT/ML ~~LOC~~ SOLN: SUBCUTANEOUS | Status: DC

## 2013-08-21 NOTE — Telephone Encounter (Signed)
Pt called requesting a refill of his humalog. Please advise.

## 2013-08-21 NOTE — Telephone Encounter (Signed)
Please call in the humalog rx to his [facility]

## 2013-08-21 NOTE — Telephone Encounter (Signed)
ok 

## 2013-08-24 ENCOUNTER — Telehealth: Payer: Self-pay | Admitting: Endocrinology

## 2013-08-24 ENCOUNTER — Other Ambulatory Visit: Payer: Self-pay | Admitting: *Deleted

## 2013-08-24 MED ORDER — INSULIN ASPART 100 UNIT/ML ~~LOC~~ SOLN: SUBCUTANEOUS | Status: DC

## 2013-08-24 NOTE — Telephone Encounter (Signed)
Pt needs a PA for his insulin. Should we switch to Novolog? Please advise.

## 2013-08-24 NOTE — Telephone Encounter (Signed)
Pt needs PA asap on his insulin he will be out today!

## 2013-08-24 NOTE — Telephone Encounter (Signed)
Novolog ok.

## 2013-08-24 NOTE — Telephone Encounter (Signed)
Called pt and advised him that Dr Dwyane Dee has ok'd a switch to Novolog. Advised pt medications are the same. Pt will let me know if his ins does not cover this. Be advised.

## 2013-08-24 NOTE — Telephone Encounter (Signed)
Called pharmacy and requested them to dispense 3 vials. Pharmacist ok'd. Done.

## 2013-09-21 ENCOUNTER — Other Ambulatory Visit (INDEPENDENT_AMBULATORY_CARE_PROVIDER_SITE_OTHER): Payer: BC Managed Care – PPO

## 2013-09-21 DIAGNOSIS — E1065 Type 1 diabetes mellitus with hyperglycemia: Secondary | ICD-10-CM

## 2013-09-21 DIAGNOSIS — IMO0002 Reserved for concepts with insufficient information to code with codable children: Secondary | ICD-10-CM

## 2013-09-21 DIAGNOSIS — Z8579 Personal history of other malignant neoplasms of lymphoid, hematopoietic and related tissues: Secondary | ICD-10-CM

## 2013-09-21 DIAGNOSIS — Z87898 Personal history of other specified conditions: Secondary | ICD-10-CM

## 2013-09-21 LAB — CBC
HCT: 45.5 % (ref 39.0–52.0)
HEMOGLOBIN: 14.9 g/dL (ref 13.0–17.0)
MCHC: 32.9 g/dL (ref 30.0–36.0)
MCV: 90.6 fl (ref 78.0–100.0)
Platelets: 123 10*3/uL — ABNORMAL LOW (ref 150.0–400.0)
RBC: 5.01 Mil/uL (ref 4.22–5.81)
RDW: 14.4 % (ref 11.5–15.5)
WBC: 4.7 10*3/uL (ref 4.0–10.5)

## 2013-09-21 LAB — BASIC METABOLIC PANEL
BUN: 15 mg/dL (ref 6–23)
CALCIUM: 9.1 mg/dL (ref 8.4–10.5)
CO2: 28 mEq/L (ref 19–32)
Chloride: 104 mEq/L (ref 96–112)
Creatinine, Ser: 1 mg/dL (ref 0.4–1.5)
GFR: 80.8 mL/min (ref >60.00)
GLUCOSE: 125 mg/dL — AB (ref 70–99)
POTASSIUM: 4.2 meq/L (ref 3.5–5.1)
SODIUM: 138 meq/L (ref 135–145)

## 2013-09-21 LAB — MICROALBUMIN / CREATININE URINE RATIO
CREATININE, U: 90.3 mg/dL
MICROALB UR: 0.2 mg/dL (ref 0.0–1.9)
MICROALB/CREAT RATIO: 0.2 mg/g (ref 0.0–30.0)

## 2013-09-21 LAB — HEMOGLOBIN A1C: Hgb A1c MFr Bld: 5.9 % (ref 4.6–6.5)

## 2013-09-23 ENCOUNTER — Ambulatory Visit (INDEPENDENT_AMBULATORY_CARE_PROVIDER_SITE_OTHER): Payer: BC Managed Care – PPO | Admitting: Endocrinology

## 2013-09-23 ENCOUNTER — Encounter: Payer: Self-pay | Admitting: Endocrinology

## 2013-09-23 VITALS — BP 116/70 | HR 50 | Temp 98.0°F | Resp 14 | Ht 73.5 in | Wt 198.6 lb

## 2013-09-23 DIAGNOSIS — IMO0002 Reserved for concepts with insufficient information to code with codable children: Secondary | ICD-10-CM

## 2013-09-23 DIAGNOSIS — E1065 Type 1 diabetes mellitus with hyperglycemia: Secondary | ICD-10-CM

## 2013-09-23 DIAGNOSIS — D696 Thrombocytopenia, unspecified: Secondary | ICD-10-CM

## 2013-09-23 NOTE — Patient Instructions (Signed)
May try Carb ratio to 1:10 at lunch if sugars after lunch > 180  Spot check sugar during night  Lab Results  Component Value Date   WBC 4.7 09/21/2013   HGB 14.9 09/21/2013   HCT 45.5 09/21/2013   MCV 90.6 09/21/2013   PLT 123.0* 09/21/2013

## 2013-09-23 NOTE — Progress Notes (Signed)
Patient ID: Corey Hudson, male   DOB: [birth year], 48 y.o.   MRN: [id number]   Insulin Pump followup:   CURRENT brand:  [id number]  HISTORY: He has a long-standing type 1 diabetes which is usually well-controlled Previously A1c readings have been as low as 5.8 although was having more hypoglycemia in the past  The pump SETTINGS are: Basal rate: 0.925 midnight-1:30 a.m.,, 1:30 a.m. = 0.8, 11 AM = 0.7, 4 PM = 0.65, 7:30 p.m. = 0.875 Carb Ratio: 1: 8 at breakfast lunch = 1:12, dinner = 1:10. Correction factor 1:40 with target 100 -110 and active insulin 4 hours  Prior history: On his last visit he was having the following problems:   Late evening hyperglycemia especially after evening meal and also some during the night  Sporadic high readings during the day  Low normal readings before supper  Since his last visit he has been much more active and he has had a tendency to low blood sugars overnight and also in the evenings He says he reduced his basal rates overnight and in the evenings about a week or so ago and his blood sugars are not getting low as often  GLUCOSE CONTROL with the pump is assessed today by download.  He is checking his blood sugar 9.3 times a day Following patterns are present  Periodic high blood sugars after lunch including recently and he is not sure if he is calculating his carbohydrates correctly  Waking up with relatively high readings last 3 mornings  Some tendency to lower readings before supper but these may be related to the correction factor used at lunchtime  Blood sugars after breakfast or relatively lower, averaging 78 compared to before breakfast average of 111  GLUCOSE meter download results:  PREMEAL Breakfast Lunch Dinner Bedtime Overall  Glucose range:  59-175     53-290    Mean/median:  111   92   105    1:15 +/-54    POST-MEAL PC Breakfast PC Lunch PC Dinner  Glucose range:       Mean/median:  78  160  114    He is doing 9.8  manual boluses and 4.9 bolus wizard events per day.  88 % boluses are using correction and overriding 22 % of the time. rewind about every 3-4 days He is asking about rotating his infusion set sites laterally because of running out of space in the mid lateral abdomen  EXERCISE: He is usually active on weekends and during the day with hunting  Hypoglycemia: He gets symptoms of confusion, feeling sleepy, anxious and at night will wake up with strange dreams and anxiety. Blood sugars may be in the 40s 50s when he has a low sugar   Lab Results  Component Value Date   HGBA1C 5.9 09/21/2013   HGBA1C 6.5 06/24/2013   HGBA1C 6.6* 03/25/2013   Lab Results  Component Value Date   MICROALBUR 0.2 09/21/2013   LDLCALC 88 06/24/2013   CREATININE 1.0 09/21/2013       Medication List       This list is accurate as of: 09/23/13  9:29 AM.  Always use your most recent med list.               acetaminophen 325 MG tablet  Commonly known as:  TYLENOL  Take 650 mg by mouth every 6 (six) hours as needed.     folic acid 1 MG tablet  Commonly known as:  FOLVITE  Take  1 mg by mouth daily.     glucose blood test strip  Commonly known as:  BAYER CONTOUR NEXT TEST  Use as instructed to check blood sugars 6 times per day dx code 250.01     insulin aspart 100 UNIT/ML injection  Commonly known as:  novoLOG  Uses max 40 units per day in pump.     onetouch ultrasoft lancets  Use as instructed to check blood sugars 10 times per day        Allergies: No Known Allergies  Past Medical History  Diagnosis Date  . Diabetes mellitus   . Cancer     No past surgical history on file.  No family history on file.  Social History:  reports that he has never smoked. He does not have any smokeless tobacco history on file. He reports that he does not drink alcohol. His drug history is not on file.  REVIEW of systems:  No history of hypertension: Prior history of hematological malignancy, has fairly normal  counts including platelets which are low normal   HYPERLIPIDEMIA: This has been mild and not on treatment  Lab Results  Component Value Date   CHOL 161 06/24/2013   HDL 61.70 06/24/2013   LDLCALC 88 06/24/2013   TRIG 57.0 06/24/2013   CHOLHDL 3 06/24/2013   He has some tingling in his feet which started after his chemotherapy  Eye exam is due  ASSESSMENT:  Type 1 diabetes,  overall well controlled with A1c 5.9 % Current blood sugar patterns and problems identified are described in detail in history of present illness He is starting to get frequent hypoglycemia because of his increased activity and has 22 glucose readings below target in the last 2 weeks Most of his hypoglycemia is overnight and late morning but also some in the evenings His hypoglycemia has improved with his reducing his basal rates on his own Current trends are as follows  Waking up with relatively high readings, at least the last 3 days  Jump in postprandial readings after lunch  Blood sugars are tending to go down after breakfast bolus  Sometimes having high readings late at night, not clear if this is from snacks or rebound  Less tendency to late night hyperglycemia with reducing his fat intake  Changes made today: Basal rate at 5 AM = 0.85, carbohydrate ratio at breakfast and 1:10 and target is 120 Consider 1:10 ratio for lunch coverage if postprandial readings are over 180  Total visit time including counseling = 25 minutes  Elayne Snare

## 2013-09-30 ENCOUNTER — Other Ambulatory Visit: Payer: Self-pay | Admitting: *Deleted

## 2013-09-30 MED ORDER — INSULIN ASPART 100 UNIT/ML ~~LOC~~ SOLN: SUBCUTANEOUS | Status: DC

## 2013-10-02 ENCOUNTER — Telehealth: Payer: Self-pay | Admitting: Endocrinology

## 2013-10-02 ENCOUNTER — Other Ambulatory Visit: Payer: Self-pay | Admitting: *Deleted

## 2013-10-02 MED ORDER — INSULIN GLARGINE 100 UNIT/ML ~~LOC~~ SOLN: SUBCUTANEOUS | Status: DC

## 2013-10-02 NOTE — Telephone Encounter (Signed)
See below and advise please.  

## 2013-10-02 NOTE — Telephone Encounter (Signed)
Pt has to speak with Suanne Marker he is on Novolog can he take still be RX Lantus as well in case there is a day he wants to remove the pump for outside recreation

## 2013-10-02 NOTE — Telephone Encounter (Signed)
Noted, patient is aware, rx sent 

## 2013-10-02 NOTE — Telephone Encounter (Signed)
He can take Lantus if he wants on those days. He can take either the insulin vial or the insulin pen which ever he prefers, with the dose the same as his basal insulin which he knows

## 2013-10-08 ENCOUNTER — Telehealth: Payer: Self-pay | Admitting: Endocrinology

## 2013-10-08 ENCOUNTER — Other Ambulatory Visit: Payer: Self-pay | Admitting: *Deleted

## 2013-10-08 MED ORDER — INSULIN ASPART 100 UNIT/ML ~~LOC~~ SOLN: SUBCUTANEOUS | Status: DC

## 2013-10-08 NOTE — Telephone Encounter (Signed)
Please verify his daily insulin requirement and prescribe accordingly

## 2013-10-08 NOTE — Telephone Encounter (Signed)
Please call pt. He is going to run out of the novolog early again. Please advise pt on how this can stop happening. He believes it is due to the dosage not coinciding with his daily regimen

## 2013-10-08 NOTE — Telephone Encounter (Signed)
Please see below and advise.

## 2013-10-08 NOTE — Telephone Encounter (Signed)
rx sent for 85 units per day with pump, 55 units was not enough with his bolus's

## 2013-11-09 ENCOUNTER — Telehealth: Payer: Self-pay | Admitting: Endocrinology

## 2013-11-09 ENCOUNTER — Other Ambulatory Visit: Payer: Self-pay | Admitting: *Deleted

## 2013-11-09 MED ORDER — GLUCOSE BLOOD VI STRP: ORAL_STRIP | Status: DC

## 2013-11-09 NOTE — Telephone Encounter (Signed)
Yes

## 2013-11-09 NOTE — Telephone Encounter (Signed)
Okay to send for that many strips?

## 2013-11-09 NOTE — Telephone Encounter (Signed)
rx sent, left message on patients vm

## 2013-11-09 NOTE — Telephone Encounter (Signed)
Patient states he receives 200 test strips and he runs out  He has been testing 10 times a day   Pharmacy: [facility]   Thank You

## 2013-12-11 ENCOUNTER — Other Ambulatory Visit: Payer: Self-pay | Admitting: *Deleted

## 2013-12-11 ENCOUNTER — Telehealth: Payer: Self-pay | Admitting: Endocrinology

## 2013-12-11 MED ORDER — BAYER MICROLET LANCETS MISC: Status: DC

## 2013-12-11 MED ORDER — ONETOUCH ULTRASOFT LANCETS MISC: Status: DC

## 2013-12-11 NOTE — Telephone Encounter (Signed)
Please call in new rx for the lancets 90 day supply pt using 300 per month

## 2013-12-11 NOTE — Telephone Encounter (Signed)
rx sent

## 2013-12-18 ENCOUNTER — Other Ambulatory Visit (INDEPENDENT_AMBULATORY_CARE_PROVIDER_SITE_OTHER): Payer: BC Managed Care – PPO

## 2013-12-18 ENCOUNTER — Other Ambulatory Visit: Payer: BC Managed Care – PPO

## 2013-12-18 DIAGNOSIS — E1065 Type 1 diabetes mellitus with hyperglycemia: Secondary | ICD-10-CM

## 2013-12-18 DIAGNOSIS — IMO0002 Reserved for concepts with insufficient information to code with codable children: Secondary | ICD-10-CM

## 2013-12-18 LAB — URINALYSIS, ROUTINE W REFLEX MICROSCOPIC
Bilirubin Urine: NEGATIVE
Hgb urine dipstick: NEGATIVE
Ketones, ur: NEGATIVE
Leukocytes, UA: NEGATIVE
NITRITE: NEGATIVE
PH: 6.5 (ref 5.0–8.0)
Specific Gravity, Urine: 1.01 (ref 1.000–1.030)
Total Protein, Urine: NEGATIVE
Urine Glucose: NEGATIVE
Urobilinogen, UA: 0.2 (ref 0.0–1.0)

## 2013-12-18 LAB — MICROALBUMIN / CREATININE URINE RATIO
Creatinine,U: 186.9 mg/dL
Microalb Creat Ratio: 0.2 mg/g (ref 0.0–30.0)
Microalb, Ur: 0.4 mg/dL (ref 0.0–1.9)

## 2013-12-18 LAB — LIPID PANEL
CHOLESTEROL: 151 mg/dL (ref 0–200)
HDL: 48.8 mg/dL (ref >39.00)
LDL CALC: 95 mg/dL (ref 0–99)
NonHDL: 102.2
TRIGLYCERIDES: 36 mg/dL (ref 0.0–149.0)
Total CHOL/HDL Ratio: 3
VLDL: 7.2 mg/dL (ref 0.0–40.0)

## 2013-12-18 LAB — BASIC METABOLIC PANEL
BUN: 17 mg/dL (ref 6–23)
CO2: 27 mEq/L (ref 19–32)
Calcium: 9.1 mg/dL (ref 8.4–10.5)
Chloride: 103 mEq/L (ref 96–112)
Creatinine, Ser: 1.1 mg/dL (ref 0.4–1.5)
GFR: 78.11 mL/min (ref >60.00)
GLUCOSE: 98 mg/dL (ref 70–99)
Potassium: 4.4 mEq/L (ref 3.5–5.1)
Sodium: 138 mEq/L (ref 135–145)

## 2013-12-18 LAB — LDL CHOLESTEROL, DIRECT: Direct LDL: 100 mg/dL

## 2013-12-18 LAB — HEMOGLOBIN A1C: Hgb A1c MFr Bld: 6.1 % (ref 4.6–6.5)

## 2013-12-24 ENCOUNTER — Ambulatory Visit (INDEPENDENT_AMBULATORY_CARE_PROVIDER_SITE_OTHER): Payer: BC Managed Care – PPO | Admitting: Endocrinology

## 2013-12-24 ENCOUNTER — Encounter: Payer: Self-pay | Admitting: Endocrinology

## 2013-12-24 VITALS — BP 128/78 | HR 68 | Temp 97.8°F | Resp 14 | Ht 73.5 in | Wt 193.4 lb

## 2013-12-24 DIAGNOSIS — Z8579 Personal history of other malignant neoplasms of lymphoid, hematopoietic and related tissues: Secondary | ICD-10-CM

## 2013-12-24 DIAGNOSIS — E1065 Type 1 diabetes mellitus with hyperglycemia: Secondary | ICD-10-CM

## 2013-12-24 DIAGNOSIS — E7800 Pure hypercholesterolemia, unspecified: Secondary | ICD-10-CM

## 2013-12-24 DIAGNOSIS — Z87898 Personal history of other specified conditions: Secondary | ICD-10-CM

## 2013-12-24 DIAGNOSIS — IMO0002 Reserved for concepts with insufficient information to code with codable children: Secondary | ICD-10-CM

## 2013-12-24 NOTE — Progress Notes (Signed)
Patient ID: Corey Hudson, male   DOB: 16-Jun-1965, 48 y.o.   MRN: [id number]   Insulin Pump followup:   CURRENT brand:  [id number]  HISTORY: He has a long-standing type 1 diabetes which is usually well-controlled Previously A1c readings have been as low as 5.8 although was having more hypoglycemia in the past  The pump SETTINGS are: Basal rate: 0.925 midnight-1:30 a.m.,, 1:30 a.m. = 0.8, 11 AM = 0.7, 4 PM = 0.65, 7:30 p.m. = 0.875 Carb Ratio: 1: 8 at breakfast lunch = 1:12, dinner = 1:10. Correction factor 1:40 with target 100 -110 and active insulin 4 hours  Prior history: On his last visit he was having the following problems:   Waking up with relatively high readings is  Increase in postprandial readings after lunch  Blood sugars  tending to go down after breakfast bolus  Sometimes having high readings late at night, not clear if this is from snacks or rebound  Since his last visit he has changed his basal rates only slightly but did not raise his sugar target to 120 as directed He has not had excessive fluctuation in his blood sugar readings and usually tries to make corrections for high readings consistently  GLUCOSE CONTROL with the pump is assessed today by download.  He is checking his blood sugar 7.5 times a day Following patterns and problems are present  Periodic high blood sugars late in the evening and around 1-2 AM which he thinks is from  Snacks high fat meals in the evenings which he has not covered adequately. However this is not consistent  Tendency to low blood sugars on waking up including readings as low as 44, this is even without doing correction boluses during the night  Tendency to low normal blood sugars midmorning and before lunch, sometimes after bolus in the morning  Variable blood sugars around supper time including some low sugars although not recently  Only rarely has had a low blood sugar of 60 at night  Overall blood sugar is averaging  123  GLUCOSE meter download results:  PREMEAL Breakfast Lunch Dinner Bedtime Overall  Glucose range:  44-155   70-159   47-152   72-243   123+/-56   Mean/median:  95     148     POST-MEAL PC Breakfast PC Lunch PC Dinner  Glucose range:     Mean/median:  102   139   142    EXERCISE: He is usually active with various activities especially in the evenings   Hypoglycemia: He gets symptoms of confusion, feeling sleepy, anxious and at night will wake up with strange dreams and anxiety. Blood sugars may be in the 40s 50s when he has a low sugar   Lab Results  Component Value Date   HGBA1C 6.1 12/18/2013   HGBA1C 5.9 09/21/2013   HGBA1C 6.5 06/24/2013   Lab Results  Component Value Date   MICROALBUR 0.4 12/18/2013   LDLCALC 95 12/18/2013   CREATININE 1.1 12/18/2013       Medication List       This list is accurate as of: 12/24/13  9:16 AM.  Always use your most recent med list.               acetaminophen 325 MG tablet  Commonly known as:  TYLENOL  Take 650 mg by mouth every 6 (six) hours as needed.     folic acid 1 MG tablet  Commonly known as:  FOLVITE  Take  1 mg by mouth daily.     glucose blood test strip  Commonly known as:  BAYER CONTOUR NEXT TEST  Use as instructed to check blood sugars 10 times per day dx code 250.01     insulin aspart 100 UNIT/ML injection  Commonly known as:  novoLOG  Uses max 85 units per day in pump.     insulin glargine 100 UNIT/ML injection  Commonly known as:  LANTUS  Use 55 units daily     BAYER MICROLET LANCETS lancets  Use as instructed to check your blood sugar 10 times per day     onetouch ultrasoft lancets  Use as instructed to check blood sugars 10 times per day        Allergies: No Known Allergies  Past Medical History  Diagnosis Date  . Diabetes mellitus   . Cancer     No past surgical history on file.  No family history on file.  Social History:  reports that he has never smoked. He does not have any smokeless  tobacco history on file. He reports that he does not drink alcohol. His drug history is not on file.  REVIEW of systems:  No history of hypertension: Prior history of hematological malignancy, has fairly normal counts including platelets which are low normal   HYPERLIPIDEMIA: This has been mild and not on treatment, LDL right at 100 and HDL is good   Lab Results  Component Value Date   CHOL 151 12/18/2013   HDL 48.80 12/18/2013   LDLCALC 95 12/18/2013   LDLDIRECT 100.0 12/18/2013   TRIG 36.0 12/18/2013   CHOLHDL 3 12/18/2013   He has some tingling in his feet which started after his chemotherapy  Asking about aching sometimes in the right flank area or mid back  Eye exam is  scheduled this month   EXAM:  BP 128/78  Pulse 68  Temp(Src) 97.8 F (36.6 C)  Resp 14  Ht 6' 1.5" (1.867 m)  Wt 193 lb 6.4 oz (87.726 kg)  BMI 25.17 kg/m2  SpO2 98%  Diabetic foot exam shows normal monofilament sensation in the toes and plantar surfaces, no skin lesions or ulcers on the feet and normal pedal pulses. Does have some scaliness of his plantar surface distally and the large toes  ASSESSMENT:  Type 1 diabetes,  overall well controlled with A1c  6.1 % Current blood sugar patterns and problems identified are  discussedin detail in  history of present illness His overall blood sugars are excellent with only some fluctuation, standard deviation 56. Does have some tendency to hypoglycemia, mostly early morning although has only 8% of his readings are below target Most of his readings midmorning are also relatively low Has mostly high readings late in the evening or late at night but not consistently and will not raise his evening basal rate as yet   Discussed using the continuous glucose monitoring and also getting the new pump with the threshold suspend and he will check into  this  Since his blood sugar patterns are fairly good will not make any major changes   Changes made today: Basal rate  at 5 AM = 0. 75, carbohydrate ratio at breakfast and 1:9 and target is 120  LIPIDS: He has borderline lipid levels and says he does not have any other risk factors will not consider statin treatment as yet  Total visit time including counseling = 25 minutes  Corey Hudson

## 2014-02-11 NOTE — Telephone Encounter (Signed)
error 

## 2014-03-01 ENCOUNTER — Other Ambulatory Visit: Payer: Self-pay | Admitting: *Deleted

## 2014-03-01 MED ORDER — INSULIN ASPART 100 UNIT/ML ~~LOC~~ SOLN: SUBCUTANEOUS | Status: DC

## 2014-03-09 ENCOUNTER — Telehealth: Payer: Self-pay | Admitting: Endocrinology

## 2014-03-09 NOTE — Telephone Encounter (Signed)
Advised patient to contact his pcp again and let them know he's not having any relief from his symptoms.

## 2014-03-09 NOTE — Telephone Encounter (Signed)
Pt has had some stomach issues but he has been told to contact his PCP he is concerned it could be related to his diabetes, please advise.

## 2014-03-22 ENCOUNTER — Other Ambulatory Visit: Payer: BC Managed Care – PPO

## 2014-03-25 ENCOUNTER — Ambulatory Visit: Payer: BC Managed Care – PPO | Admitting: Endocrinology

## 2014-03-25 ENCOUNTER — Encounter: Payer: Self-pay | Admitting: *Deleted

## 2014-03-25 ENCOUNTER — Telehealth: Payer: Self-pay | Admitting: Endocrinology

## 2014-03-25 NOTE — Telephone Encounter (Signed)
Follow up advised. Contact patient and schedule visit asap

## 2014-03-25 NOTE — Telephone Encounter (Signed)
Letter mailed

## 2014-03-25 NOTE — Telephone Encounter (Signed)
Patient no showed today's appt. Please advise on how to follow up. °A. No follow up necessary. °B. Follow up urgent. Contact patient immediately. °C. Follow up necessary. Contact patient and schedule visit in ___ days. °D. Follow up advised. Contact patient and schedule visit in ____weeks. ° °

## 2014-04-19 ENCOUNTER — Other Ambulatory Visit (INDEPENDENT_AMBULATORY_CARE_PROVIDER_SITE_OTHER): Payer: BC Managed Care – PPO

## 2014-04-19 DIAGNOSIS — Z8579 Personal history of other malignant neoplasms of lymphoid, hematopoietic and related tissues: Secondary | ICD-10-CM

## 2014-04-19 DIAGNOSIS — E1065 Type 1 diabetes mellitus with hyperglycemia: Secondary | ICD-10-CM

## 2014-04-19 DIAGNOSIS — IMO0002 Reserved for concepts with insufficient information to code with codable children: Secondary | ICD-10-CM

## 2014-04-19 LAB — COMPREHENSIVE METABOLIC PANEL
ALBUMIN: 4.1 g/dL (ref 3.5–5.2)
ALK PHOS: 50 U/L (ref 39–117)
ALT: 22 U/L (ref 0–53)
AST: 25 U/L (ref 0–37)
BILIRUBIN TOTAL: 0.7 mg/dL (ref 0.2–1.2)
BUN: 20 mg/dL (ref 6–23)
CO2: 31 meq/L (ref 19–32)
Calcium: 9.2 mg/dL (ref 8.4–10.5)
Chloride: 101 mEq/L (ref 96–112)
Creatinine, Ser: 1 mg/dL (ref 0.4–1.5)
GFR: 86.33 mL/min (ref >60.00)
Glucose, Bld: 98 mg/dL (ref 70–99)
Potassium: 4.4 mEq/L (ref 3.5–5.1)
SODIUM: 137 meq/L (ref 135–145)
Total Protein: 6.5 g/dL (ref 6.0–8.3)

## 2014-04-19 LAB — CBC
HEMATOCRIT: 46.4 % (ref 39.0–52.0)
Hemoglobin: 15 g/dL (ref 13.0–17.0)
MCHC: 32.3 g/dL (ref 30.0–36.0)
MCV: 89.3 fl (ref 78.0–100.0)
Platelets: 142 10*3/uL — ABNORMAL LOW (ref 150.0–400.0)
RBC: 5.2 Mil/uL (ref 4.22–5.81)
RDW: 13.7 % (ref 11.5–15.5)
WBC: 5.6 10*3/uL (ref 4.0–10.5)

## 2014-04-19 LAB — HEMOGLOBIN A1C: Hgb A1c MFr Bld: 6.4 % (ref 4.6–6.5)

## 2014-04-21 ENCOUNTER — Ambulatory Visit (INDEPENDENT_AMBULATORY_CARE_PROVIDER_SITE_OTHER): Payer: BC Managed Care – PPO | Admitting: Endocrinology

## 2014-04-21 ENCOUNTER — Encounter: Payer: Self-pay | Admitting: Endocrinology

## 2014-04-21 VITALS — BP 111/64 | HR 50 | Temp 98.0°F | Resp 14 | Ht 73.5 in | Wt 196.8 lb

## 2014-04-21 DIAGNOSIS — R197 Diarrhea, unspecified: Secondary | ICD-10-CM

## 2014-04-21 DIAGNOSIS — E109 Type 1 diabetes mellitus without complications: Secondary | ICD-10-CM

## 2014-04-21 NOTE — Progress Notes (Signed)
Patient ID: Corey Hudson, male   DOB: October 28, 1965, 48 y.o.   MRN: [id number]   Chief complaint: Type 1 diabetes followup:   CURRENT insulin pump brand:  Medtronic [id number]  Prior history: He has a long-standing type 1 diabetes which is usually well-controlled Previously A1c readings have been as low as 5.8 although was having more hypoglycemia in the past  The pump SETTINGS are: Basal rate: 0.925 midnight-1:30 a.m.,, 1:30 a.m. = 0.8, 11 AM = 0.7, 4 PM = 0.65, 7:30 p.m. = 0.875 Carb Ratio: 1: 8 at breakfast, lunch = 1:12, dinner = 1:10. Correction factor 1:40 with target 100 -110 and active insulin 4 hours  HISTORY:  On his last visit he was advised to reduce his 5 AM basal rate because of low normal blood sugars and reduce carbohydrate coverage at breakfast but he did not do so. He is usually given a copy of his settings but not clear if he forgets to do the changes He usually tries to make changes in his pump settings for consistent high readings   GLUCOSE CONTROL with the pump is assessed today by download.  He is checking his blood sugar 9.6 times a day Following patterns and problems are present  Mostly high blood sugars during the night when he will take an extra bolus.  Most of the readings are checked between 2 AM-5 AM and range from 89-207   Fasting blood sugars are low normal but usually not below 70  Blood sugars tend to be low normal again at lunchtime with only one episode of low blood sugar possibly from extra boluses in between the meals some tendency to low sugars in the early afternoon with the lowest reading after lunchtime bolus  Blood sugars are tending to be higher later in the evenings but not consistently  Did have some high readings around supper time over the holidays  Overall has 9% of readings below the target range and 33% above the target  Postprandial readings as identified on the insulin pump average 100 after breakfast, 111 after lunch and 136 after  dinner  GLUCOSE meter download results:  PRE-MEAL Breakfast Lunch Dinner Bedtime Overall  Glucose range:  70-205   38-154   64-214   69-209    Mean/median:  93   103    125  124   EXERCISE: He is usually active with various activities although mostly seasonal  Hypoglycemia: He gets symptoms of confusion, feeling sleepy, anxious and at night will wake up with strange dreams and anxiety. Blood sugars may be in the 40s 50s when he has a low sugar   Lab Results  Component Value Date   HGBA1C 6.4 04/19/2014   HGBA1C 6.1 12/18/2013   HGBA1C 5.9 09/21/2013   Lab Results  Component Value Date   MICROALBUR 0.4 12/18/2013   [place] 95 12/18/2013   CREATININE 1.0 04/19/2014       Medication List       This list is accurate as of: 04/21/14  4:12 PM.  Always use your most recent med list.               acetaminophen 325 MG tablet  Commonly known as:  TYLENOL  Take 650 mg by mouth every 6 (six) hours as needed.     ALIGN 4 MG Caps  Take 4 mg by mouth.     folic acid 1 MG tablet  Commonly known as:  FOLVITE  Take 1 mg by mouth daily.  glucose blood test strip  Commonly known as:  BAYER CONTOUR NEXT TEST  Use as instructed to check blood sugars 10 times per day dx code 250.01     insulin aspart 100 UNIT/ML injection  Commonly known as:  novoLOG  Uses max 85 units per day in pump.     insulin glargine 100 UNIT/ML injection  Commonly known as:  LANTUS  Use 55 units daily     BAYER MICROLET LANCETS lancets  Use as instructed to check your blood sugar 10 times per day     onetouch ultrasoft lancets  Use as instructed to check blood sugars 10 times per day        Allergies: No Known Allergies  Past Medical History  Diagnosis Date  . Diabetes mellitus   . Cancer     No past surgical history on file.  No family history on file.  Social History:  reports that he has never smoked. He does not have any smokeless tobacco history on file. He reports that he  does not drink alcohol. His drug history is not on file.  REVIEW of systems:  Loose stools for 3 months: He has not discussed this with his PCP.  He does not have any blood or mucus with this but has some urgency.  And frequently will have loose stools in the mornings without abdominal pain.  He tried OTC probiotic with some success  No history of hypertension  Prior history of hematological malignancy, has fairly normal counts including platelets which are low normal, labs checked at patient request   HYPERLIPIDEMIA: Minimal, LDL consistently at 100 and HDL is good   Lab Results  Component Value Date   CHOL 151 12/18/2013   HDL 48.80 12/18/2013   LDLCALC 95 12/18/2013   LDLDIRECT 100.0 12/18/2013   TRIG 36.0 12/18/2013   CHOLHDL 3 12/18/2013   He has occasional tingling in his feet which started after his chemotherapy  Diabetic foot in 9/15 showed  normal monofilament sensation in the toes and plantar surfaces, no skin lesions or ulcers on the feet and normal pedal pulses.  Had scaliness of his plantar surface distally and the large toes  Eye exam done in 2015  EXAM:  BP 111/64 mmHg  Pulse 50  Temp(Src) 98 F (36.7 C)  Resp 14  Ht 6' 1.5" (1.867 m)  Wt 196 lb 12.8 oz (89.268 kg)  BMI 25.61 kg/m2  SpO2 98%   ASSESSMENT:  Type 1 diabetes,  overall well controlled with A1c 6.4 % Current blood sugar patterns and problems identified are discussed in detail in  history of present illness His overall blood sugars are excellent with the usual fluctuation especially in the evenings related to his diet Has fairly consistently high readings during the night requiring additional boluses Most of his hypoglycemia tends to be between 11 AM-4 PM only partly related to increased activity; he usually tries to use a temporary basal if he is very active but this is not identified on his download recently  Discussed using the continuous glucose monitoring again but he is not interested as  yet  Since his blood sugar patterns are shown need for increase basal rate during the night and reduced basal rates from 5 AM-4 PM will make adjustments as follows  Changes recommended today: Basal rate at 1:30 AM = 0.85, 5 AM = 0. 75, 11 AM = 0.65 carbohydrate ratio at breakfast = 1:9 and at lunch 1:14  Patient was given a copy  of the changes and to make these on his pump at home  Diarrhea: This appears to be relatively chronic and etiology unclear, likely to be from diabetic autonomic neuropathy since he has no other systemic effects of diabetes especially neuropathy. He will be referred for GI consultation   Total visit time including counseling = 25 minutes  Kailer Heindel

## 2014-04-21 NOTE — Patient Instructions (Signed)
Reduce Carb ratio at Bfst, lunch and basals as on printout

## 2014-06-15 ENCOUNTER — Telehealth: Payer: Self-pay | Admitting: Endocrinology

## 2014-06-15 MED ORDER — GLUCOSE BLOOD VI STRP: ORAL_STRIP | Status: DC

## 2014-06-15 MED ORDER — ONETOUCH ULTRASOFT LANCETS MISC: Status: DC

## 2014-06-15 MED ORDER — INSULIN ASPART 100 UNIT/ML ~~LOC~~ SOLN: SUBCUTANEOUS | Status: DC

## 2014-06-15 MED ORDER — INSULIN GLARGINE 100 UNIT/ML ~~LOC~~ SOLN: SUBCUTANEOUS | Status: DC

## 2014-06-15 NOTE — Telephone Encounter (Signed)
Verbal given to pt's pharmacy for test strips. Pt notified.

## 2014-06-15 NOTE — Telephone Encounter (Signed)
Pt states pharmacy sent insulin and test strips and lancets forms to [place]. He does need to pick this stuff up today can we get this called in for him. Sent to [facility]

## 2014-06-15 NOTE — Telephone Encounter (Signed)
Pt is at pharmacy waiting please call pt when this is done today! Thank you!

## 2014-06-15 NOTE — Telephone Encounter (Signed)
Pt needs bayer test strips not the one touch please change script

## 2014-06-15 NOTE — Telephone Encounter (Signed)
Rx sent per request. 

## 2014-07-15 ENCOUNTER — Other Ambulatory Visit: Payer: BC Managed Care – PPO

## 2014-07-20 ENCOUNTER — Ambulatory Visit: Payer: BC Managed Care – PPO | Admitting: Endocrinology

## 2014-07-30 ENCOUNTER — Other Ambulatory Visit (INDEPENDENT_AMBULATORY_CARE_PROVIDER_SITE_OTHER): Payer: BLUE CROSS/BLUE SHIELD

## 2014-07-30 DIAGNOSIS — E109 Type 1 diabetes mellitus without complications: Secondary | ICD-10-CM | POA: Diagnosis not present

## 2014-07-30 LAB — BASIC METABOLIC PANEL
BUN: 14 mg/dL (ref 6–23)
CO2: 30 mEq/L (ref 19–32)
CREATININE: 0.93 mg/dL (ref 0.40–1.50)
Calcium: 9.4 mg/dL (ref 8.4–10.5)
Chloride: 103 mEq/L (ref 96–112)
GFR: 91.6 mL/min (ref >60.00)
Glucose, Bld: 69 mg/dL — ABNORMAL LOW (ref 70–99)
Potassium: 4.3 mEq/L (ref 3.5–5.1)
Sodium: 137 mEq/L (ref 135–145)

## 2014-07-30 LAB — HEMOGLOBIN A1C: Hgb A1c MFr Bld: 6.1 % (ref 4.6–6.5)

## 2014-08-04 ENCOUNTER — Ambulatory Visit (INDEPENDENT_AMBULATORY_CARE_PROVIDER_SITE_OTHER): Payer: BLUE CROSS/BLUE SHIELD | Admitting: Endocrinology

## 2014-08-04 ENCOUNTER — Encounter: Payer: Self-pay | Admitting: Endocrinology

## 2014-08-04 VITALS — BP 102/60 | HR 78 | Temp 98.0°F | Ht 73.5 in | Wt 196.1 lb

## 2014-08-04 DIAGNOSIS — E7800 Pure hypercholesterolemia, unspecified: Secondary | ICD-10-CM

## 2014-08-04 DIAGNOSIS — Z8579 Personal history of other malignant neoplasms of lymphoid, hematopoietic and related tissues: Secondary | ICD-10-CM

## 2014-08-04 DIAGNOSIS — Z8572 Personal history of non-Hodgkin lymphomas: Secondary | ICD-10-CM

## 2014-08-04 DIAGNOSIS — E109 Type 1 diabetes mellitus without complications: Secondary | ICD-10-CM

## 2014-08-04 NOTE — Progress Notes (Signed)
Pre visit review using our clinic review tool, if applicable. No additional management support is needed unless otherwise documented below in the visit note. 

## 2014-08-04 NOTE — Patient Instructions (Signed)
Adjust basal as directed

## 2014-08-04 NOTE — Progress Notes (Signed)
Patient ID: Corey Hudson, male   DOB: 20-Feb-1966, 49 y.o.   MRN: [id number]   Chief complaint: Type 1 diabetes followup:   CURRENT insulin pump brand:  Medtronic [id number]  Prior history: He has a long-standing type 1 diabetes which is usually well-controlled Previously A1c readings have been as low as 5.8 although was having more hypoglycemia in the past  The pump SETTINGS are: Basal rate: 0.925 midnight-1:30 a.m.,, 1:30 a.m. = 0.8, 11 AM = 0.7, 4 PM = 0.65, 7:30 p.m. = 0.875 Carb Ratio: 1: 8 at breakfast, lunch = 1:12, dinner = 1:10. Correction factor 1:40 with target 100 -110 and active insulin 4 hours  HISTORY:  His blood sugar control is overall still fairly good and again has upper normal A1c He usually tries to make changes in his pump settings for consistent high readings However he does not follow instructions for changing his basal rates even though a copy of this is usually given at his office visit  GLUCOSE CONTROL with the pump is assessed today by download.  He is checking his blood sugar recently about 4-5 times a day according to his monitor download Following patterns and problems are present  He has had tendency to significant hypoglycemia during the night and on waking up until about 10 days ago  More recent fasting blood sugars are near normal  HYPOGLYCEMIA recently has been mostly late at night or around midnight-1 AM, sometimes related to increased activity or overestimating bolusing for his evening meal; however his lowest blood sugars are around that time  He has sporadic high blood sugars POSTPRANDIALLY after breakfast or lunch but generally because of difficulty estimating his bolusing requirement for eating out  Blood sugars on average are the highest around supper time but not consistently over 150  Recently has not been blinking his meter to the pump and difficult to analyze his blood sugar patterns especially postprandial   GLUCOSE meter download  results:  PRE-MEAL Breakfast Lunch Dinner Bedtime Overall  Glucose range:   64-119   70-186   73-201   58-208    Mean/median:  84     123   125    POST-MEAL PC Breakfast PC Lunch  overnight   Glucose range:  90-296   44-317   Mean/median:      EXERCISE: He is usually active with various activities during spring and summer, currently only occasionally doing physical activities or walking  Hypoglycemia: He gets symptoms of confusion, feeling sleepy, anxious and at night will wake up with strange dreams and anxiety. Blood sugars may be in the 40s 50s when he has a low sugar   Lab Results  Component Value Date   HGBA1C 6.1 07/30/2014   HGBA1C 6.4 04/19/2014   HGBA1C 6.1 12/18/2013   Lab Results  Component Value Date   MICROALBUR 0.4 12/18/2013   [place] 95 12/18/2013   CREATININE 0.93 07/30/2014       Medication List       This list is accurate as of: 08/04/14  4:04 PM.  Always use your most recent med list.               acetaminophen 325 MG tablet  Commonly known as:  TYLENOL  Take 650 mg by mouth every 6 (six) hours as needed.     ALIGN 4 MG Caps  Take 4 mg by mouth.     BAYER MICROLET LANCETS lancets  Use as instructed to check your blood sugar 10  times per day     folic acid 1 MG tablet  Commonly known as:  FOLVITE  Take 1 mg by mouth daily.     glucose blood test strip  Commonly known as:  BAYER CONTOUR NEXT TEST  Use as instructed to check blood sugars 10 times per day dx code 250.01     insulin aspart 100 UNIT/ML injection  Commonly known as:  novoLOG  Uses max 85 units per day in pump.     insulin glargine 100 UNIT/ML injection  Commonly known as:  LANTUS  Use 55 units daily        Allergies: No Known Allergies  Past Medical History  Diagnosis Date  . Diabetes mellitus   . Cancer     No past surgical history on file.  No family history on file.  Social History:  reports that he has never smoked. He does not have any smokeless tobacco  history on file. He reports that he does not drink alcohol. His drug history is not on file.  REVIEW of systems:  Loose stools: He has had a colonoscopy and was told to have some form of colitis but has not taken any steroids that were recommended  No history of hypertension  Prior history of hematological malignancy, has fairly normal counts including platelets which are low normal, labs checked at patient request   HYPERLIPIDEMIA: Minimal, LDL consistently at 100 and HDL is good   Lab Results  Component Value Date   CHOL 151 12/18/2013   HDL 48.80 12/18/2013   LDLCALC 95 12/18/2013   LDLDIRECT 100.0 12/18/2013   TRIG 36.0 12/18/2013   CHOLHDL 3 12/18/2013   He has occasional tingling in his feet which started after his chemotherapy  Diabetic foot in 9/15 showed  normal monofilament sensation in the toes and plantar surfaces, no skin lesions or ulcers on the feet and normal pedal pulses.  Had scaliness of his plantar surface distally and the large toes  Eye exam done in 2015  EXAM:  BP 102/60 mmHg  Pulse 78  Temp(Src) 98 F (36.7 C) (Oral)  Ht 6' 1.5" (1.867 m)  Wt 196 lb 2 oz (88.962 kg)  BMI 25.52 kg/m2  SpO2 98%   ASSESSMENT:  Type 1 diabetes, overall well controlled with A1c 6.1 % Current blood sugar patterns and problems identified are discussed in detail in  history of present illness Although his average blood sugar is only 125 he has some fluctuation in his blood sugars at various times He has tendency to hypoglycemia periodically at all times but mostly right after midnight; not clear why he was having more frequent hypoglycemia overnight about 10 days ago He will need to reduce his basal rates late at night and at midnight also  He is generally able to cover his meals fairly well but occasionally with mixed meals at restaurants especially he tends to have high readings Still very compliant with checking his blood sugar but recently not blinking his new  monitor with the pump He does know how to prevent hypoglycemia when he tries to exercise with reducing his basal rate  Continue same about her coverage unless blood sugars are more consistently high after meals especially lunch  Changes recommended today: Basal rate changes: Midnight = 0.875 and 11 PM = 0.825 Change sensitivity 1:50 at midnight until 7 AM and continue 1:40 during the day  Patient was given a copy of the changes and to make these on his pump at home  Total visit time including counseling = 25 minutes  Devynn Scheff

## 2014-10-29 ENCOUNTER — Other Ambulatory Visit: Payer: BLUE CROSS/BLUE SHIELD

## 2014-11-01 ENCOUNTER — Other Ambulatory Visit (INDEPENDENT_AMBULATORY_CARE_PROVIDER_SITE_OTHER): Payer: BLUE CROSS/BLUE SHIELD

## 2014-11-01 DIAGNOSIS — Z8579 Personal history of other malignant neoplasms of lymphoid, hematopoietic and related tissues: Secondary | ICD-10-CM

## 2014-11-01 DIAGNOSIS — E109 Type 1 diabetes mellitus without complications: Secondary | ICD-10-CM | POA: Diagnosis not present

## 2014-11-01 LAB — COMPREHENSIVE METABOLIC PANEL
ALT: 18 U/L (ref 0–53)
AST: 21 U/L (ref 0–37)
Albumin: 3.9 g/dL (ref 3.5–5.2)
Alkaline Phosphatase: 53 U/L (ref 39–117)
BILIRUBIN TOTAL: 0.9 mg/dL (ref 0.2–1.2)
BUN: 21 mg/dL (ref 6–23)
CHLORIDE: 102 meq/L (ref 96–112)
CO2: 30 mEq/L (ref 19–32)
CREATININE: 1.08 mg/dL (ref 0.40–1.50)
Calcium: 9.2 mg/dL (ref 8.4–10.5)
GFR: 77 mL/min (ref >60.00)
Glucose, Bld: 107 mg/dL — ABNORMAL HIGH (ref 70–99)
POTASSIUM: 4.6 meq/L (ref 3.5–5.1)
Sodium: 137 mEq/L (ref 135–145)
TOTAL PROTEIN: 6.3 g/dL (ref 6.0–8.3)

## 2014-11-01 LAB — LIPID PANEL
CHOL/HDL RATIO: 3
Cholesterol: 175 mg/dL (ref 0–200)
HDL: 57.3 mg/dL (ref >39.00)
LDL Cholesterol: 110 mg/dL — ABNORMAL HIGH (ref 0–99)
NonHDL: 117.7
Triglycerides: 40 mg/dL (ref 0.0–149.0)
VLDL: 8 mg/dL (ref 0.0–40.0)

## 2014-11-01 LAB — CBC
HCT: 45.5 % (ref 39.0–52.0)
Hemoglobin: 15.1 g/dL (ref 13.0–17.0)
MCHC: 33.2 g/dL (ref 30.0–36.0)
MCV: 88.8 fl (ref 78.0–100.0)
Platelets: 139 10*3/uL — ABNORMAL LOW (ref 150.0–400.0)
RBC: 5.12 Mil/uL (ref 4.22–5.81)
RDW: 13.5 % (ref 11.5–15.5)
WBC: 4.8 10*3/uL (ref 4.0–10.5)

## 2014-11-01 LAB — MICROALBUMIN / CREATININE URINE RATIO
CREATININE, U: 96.2 mg/dL
Microalb Creat Ratio: 0.7 mg/g (ref 0.0–30.0)

## 2014-11-01 LAB — HEMOGLOBIN A1C: HEMOGLOBIN A1C: 6 % (ref 4.6–6.5)

## 2014-11-03 ENCOUNTER — Encounter: Payer: Self-pay | Admitting: Endocrinology

## 2014-11-03 ENCOUNTER — Ambulatory Visit (INDEPENDENT_AMBULATORY_CARE_PROVIDER_SITE_OTHER): Payer: BLUE CROSS/BLUE SHIELD | Admitting: Endocrinology

## 2014-11-03 VITALS — BP 116/72 | HR 69 | Temp 98.1°F | Resp 14 | Ht 73.5 in | Wt 194.0 lb

## 2014-11-03 DIAGNOSIS — E109 Type 1 diabetes mellitus without complications: Secondary | ICD-10-CM

## 2014-11-03 DIAGNOSIS — E7800 Pure hypercholesterolemia, unspecified: Secondary | ICD-10-CM

## 2014-11-03 NOTE — Progress Notes (Signed)
Patient ID: Corey Hudson, male   DOB: 03/29/1966, 49 y.o.   MRN: [id number]   Chief complaint: Type 1 diabetes followup:   CURRENT insulin pump brand:  Medtronic [id number]  Prior history: He has a long-standing type 1 diabetes which is usually well-controlled Previously A1c readings have been as low as 5.8 although was having more hypoglycemia in the past  The pump SETTINGS are: Basal rate: 0.925 midnight-1:30 a.m., 1:30 a.m. = 0.85, 5 AM = 0.75, 11 AM = 65, 4 PM = 0.65, 7:30 p.m. = 0.875 Carb Ratio: 1: 9 at breakfast, lunch = 1:14,  dinner = 1:10. Correction factor 1:40 with target 100 -110 and active insulin 4 hours, correction 1:50 at midnight,  HISTORY:   His blood sugar control is overall  well controlled and excellent A1c again at 6% Although he has not been making changes recently in his basal rates the previously has done this when the blood sugars are high  GLUCOSE CONTROL with the pump is assessed today by download.  He is checking his blood sugar recently about 10x a day according to his monitor download  Following patterns and problems are present  He has had tendency to significant hypoglycemia  mostly around lunchtime and around 6 PM and rare hypoglycemia at other times  His blood sugars during the night are tending to be high most of the time when he checks them around 1-4 AM.  He thinks this is from eating bedtime snacks but usually covering them and having only small amount of high fat intake with peanut butter  Fasting blood sugars are generally near normal without hypoglycemia    most of his hyperglycemia is overnight with only a few high readings in the early evening or around 9-11 PM  GLUCOSE download results:  Mean values apply above for all meters except median for One Touch  PRE-MEAL Fasting Lunch Dinner Bedtime Overall  Glucose range:  71-13   38-100   41-246 51-212    Mean/median:  122     131   130+/-61    POST-MEAL PC Breakfast PC Lunch PC Dinner   Glucose range:     Mean/median:  118   156   125   EXERCISE: He is usually active with various activities during spring and summer, currently only occasionally doing physical activities or walking  Hypoglycemia: He gets symptoms of confusion, feeling sleepy, anxious and at night will wake up with strange dreams and anxiety. Blood sugars may be in the 40s 50s when he has a low sugar   Lab Results  Component Value Date   HGBA1C 6.0 11/01/2014   HGBA1C 6.1 07/30/2014   HGBA1C 6.4 04/19/2014   Lab Results  Component Value Date   MICROALBUR <0.7 11/01/2014   LDLCALC 110* 11/01/2014   CREATININE 1.08 11/01/2014       Medication List       This list is accurate as of: 11/03/14 10:52 AM.  Always use your most recent med list.               acetaminophen 325 MG tablet  Commonly known as:  TYLENOL  Take 650 mg by mouth every 6 (six) hours as needed.     ALIGN 4 MG Caps  Take 4 mg by mouth.     BAYER MICROLET LANCETS lancets  Use as instructed to check your blood sugar 10 times per day     folic acid 1 MG tablet  Commonly known as:  [employer]  Take 1 mg by mouth daily.     glucose blood test strip  Commonly known as:  BAYER CONTOUR NEXT TEST  Use as instructed to check blood sugars 10 times per day dx code 250.01     insulin aspart 100 UNIT/ML injection  Commonly known as:  novoLOG  Uses max 85 units per day in pump.     insulin glargine 100 UNIT/ML injection  Commonly known as:  LANTUS  Use 55 units daily     multivitamin tablet  Take 1 tablet by mouth daily.        Allergies: No Known Allergies  Past Medical History  Diagnosis Date  . Diabetes mellitus   . Cancer     No past surgical history on file.  Family History  Problem Relation Age of Onset  . Diabetes Paternal Aunt   . Heart disease Maternal Grandfather     Social History:  reports that he has never smoked. He does not have any smokeless tobacco history on file. He reports that he does not  drink alcohol. His drug history is not on file.  REVIEW of systems:  No history of hypertension  Prior history of hematological malignancy, has fairly normal counts including platelets which are low normal  followed by hematologist   HYPERLIPIDEMIA: Minimal, LDL consistently at 100 and HDL is good   Lab Results  Component Value Date   CHOL 175 11/01/2014   HDL 57.30 11/01/2014   LDLCALC 110* 11/01/2014   LDLDIRECT 100.0 12/18/2013   TRIG 40.0 11/01/2014   CHOLHDL 3 11/01/2014   He has occasional tingling in his feet which started after his chemotherapy  Diabetic foot in 9/15 showed  normal monofilament sensation in the toes and plantar surfaces, no skin lesions or ulcers on the feet and normal pedal pulses.  Had scaliness of his plantar surface distally and the large toes  Eye exam done in 2015  EXAM:  BP 116/72 mmHg  Pulse 69  Temp(Src) 98.1 F (36.7 C)  Resp 14  Ht 6' 1.5" (1.867 m)  Wt 194 lb (87.998 kg)  BMI 25.25 kg/m2  SpO2 98%   ASSESSMENT:  Type 1 diabetes, overall well controlled with A1c 6.1 % Current blood sugar patterns and problems identified are discussed in detail in  history of present illness His main difficulties are tendency to hypoglycemia before lunch and supper as well as tendency to high blood sugars during the night for which he has to do corrections.  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 Also he has the option of using the DexCom which related to his smart phone and given him the brochure on this He will look into the details of the continuous glucose monitoring systems  He will be needing to make some changes in his pump settings as below to have better consistent control   Changes recommended today: Basal rate changes:  1:30 AM = 0.9, 11 AM and 4 PM =  0.6 Carbohydrate ratio 1:10 at breakfast  HYPERCHOLESTEROLEMIA: He is having an LDL over 100.  Discussed indications of needing a statin drug with his diabetes but he is reluctant to do this and he thinks he can improve his diet especially with reducing high-fat dairy products and eggs Will discuss this again on follow-up  Recommended follow by exam and will await the result   Counseling time on subjects discussed above is over 50% of today's 25 minute visit  Hilda Rynders

## 2014-11-03 NOTE — Patient Instructions (Addendum)
Reduce basals as directed  Low saturated diet  Lab on 11/01/2014  Component Date Value Ref Range Status  . Hgb A1c MFr Bld 11/01/2014 6.0  4.6 - 6.5 % Final   Glycemic Control Guidelines for People with Diabetes:Non Diabetic:  <6%Goal of Therapy: <7%Additional Action Suggested:  >8%   . Sodium 11/01/2014 137  135 - 145 mEq/L Final  . Potassium 11/01/2014 4.6  3.5 - 5.1 mEq/L Final  . Chloride 11/01/2014 102  96 - 112 mEq/L Final  . CO2 11/01/2014 30  19 - 32 mEq/L Final  . Glucose, Bld 11/01/2014 107* 70 - 99 mg/dL Final  . BUN 11/01/2014 21  6 - 23 mg/dL Final  . Creatinine, Ser 11/01/2014 1.08  0.40 - 1.50 mg/dL Final  . Total Bilirubin 11/01/2014 0.9  0.2 - 1.2 mg/dL Final  . Alkaline Phosphatase 11/01/2014 53  39 - 117 U/L Final  . AST 11/01/2014 21  0 - 37 U/L Final  . ALT 11/01/2014 18  0 - 53 U/L Final  . Total Protein 11/01/2014 6.3  6.0 - 8.3 g/dL Final  . Albumin 11/01/2014 3.9  3.5 - 5.2 g/dL Final  . Calcium 11/01/2014 9.2  8.4 - 10.5 mg/dL Final  . GFR 11/01/2014 77.00  >60.00 mL/min Final  . Cholesterol 11/01/2014 175  0 - 200 mg/dL Final   ATP III Classification       Desirable:  < 200 mg/dL               Borderline High:  200 - 239 mg/dL          High:  > = 240 mg/dL  . Triglycerides 11/01/2014 40.0  0.0 - 149.0 mg/dL Final   Normal:  <150 mg/dLBorderline High:  150 - 199 mg/dL  . HDL 11/01/2014 57.30  >39.00 mg/dL Final  . VLDL 11/01/2014 8.0  0.0 - 40.0 mg/dL Final  . LDL Cholesterol 11/01/2014 110* 0 - 99 mg/dL Final  . Total CHOL/HDL Ratio 11/01/2014 3   Final                  Men          Women1/2 Average Risk     3.4          3.3Average Risk          5.0          4.42X Average Risk          9.6          7.13X Average Risk          15.0          11.0                      . NonHDL 11/01/2014 117.70   Final   NOTE:  Non-HDL goal should be 30 mg/dL higher than patient's LDL goal (i.e. LDL goal of < 70 mg/dL, would have non-HDL goal of < 100 mg/dL)  . Microalb, Ur  11/01/2014 <0.7  0.0 - 1.9 mg/dL Final  . Creatinine,U 11/01/2014 96.2   Final  . Microalb Creat Ratio 11/01/2014 0.7  0.0 - 30.0 mg/g Final  . WBC 11/01/2014 4.8  4.0 - 10.5 K/uL Final  . RBC 11/01/2014 5.12  4.22 - 5.81 Mil/uL Final  . Platelets 11/01/2014 139.0* 150.0 - 400.0 K/uL Final  . Hemoglobin 11/01/2014 15.1  13.0 - 17.0 g/dL Final  . HCT 11/01/2014 45.5  39.0 -  52.0 % Final  . MCV 11/01/2014 88.8  78.0 - 100.0 fl Final  . MCHC 11/01/2014 33.2  30.0 - 36.0 g/dL Final  . RDW 11/01/2014 13.5  11.5 - 15.5 % Final

## 2014-11-05 ENCOUNTER — Other Ambulatory Visit: Payer: Self-pay | Admitting: *Deleted

## 2014-11-05 MED ORDER — INSULIN ASPART 100 UNIT/ML ~~LOC~~ SOLN: SUBCUTANEOUS | Status: DC

## 2014-12-24 ENCOUNTER — Other Ambulatory Visit: Payer: Self-pay | Admitting: *Deleted

## 2014-12-24 MED ORDER — GLUCOSE BLOOD VI STRP: ORAL_STRIP | Status: DC

## 2015-01-31 ENCOUNTER — Other Ambulatory Visit (INDEPENDENT_AMBULATORY_CARE_PROVIDER_SITE_OTHER): Payer: BLUE CROSS/BLUE SHIELD

## 2015-01-31 DIAGNOSIS — E7800 Pure hypercholesterolemia, unspecified: Secondary | ICD-10-CM | POA: Diagnosis not present

## 2015-01-31 DIAGNOSIS — E109 Type 1 diabetes mellitus without complications: Secondary | ICD-10-CM

## 2015-01-31 LAB — LIPID PANEL
Cholesterol: 152 mg/dL (ref 0–200)
HDL: 53.7 mg/dL (ref >39.00)
LDL Cholesterol: 89 mg/dL (ref 0–99)
NONHDL: 98.6
Total CHOL/HDL Ratio: 3
Triglycerides: 50 mg/dL (ref 0.0–149.0)
VLDL: 10 mg/dL (ref 0.0–40.0)

## 2015-01-31 LAB — BASIC METABOLIC PANEL
BUN: 15 mg/dL (ref 6–23)
CO2: 30 meq/L (ref 19–32)
Calcium: 9.1 mg/dL (ref 8.4–10.5)
Chloride: 102 mEq/L (ref 96–112)
Creatinine, Ser: 0.95 mg/dL (ref 0.40–1.50)
GFR: 89.19 mL/min (ref >60.00)
GLUCOSE: 152 mg/dL — AB (ref 70–99)
POTASSIUM: 4.1 meq/L (ref 3.5–5.1)
SODIUM: 138 meq/L (ref 135–145)

## 2015-01-31 LAB — HEMOGLOBIN A1C: HEMOGLOBIN A1C: 6.1 % (ref 4.6–6.5)

## 2015-02-03 ENCOUNTER — Ambulatory Visit: Payer: BLUE CROSS/BLUE SHIELD | Admitting: Endocrinology

## 2015-02-03 ENCOUNTER — Telehealth: Payer: Self-pay | Admitting: Endocrinology

## 2015-02-03 NOTE — Telephone Encounter (Signed)
Patient no showed today's appt. Please advise on how to follow up. °A. No follow up necessary. °B. Follow up urgent. Contact patient immediately. °C. Follow up necessary. Contact patient and schedule visit in ___ days. °D. Follow up advised. Contact patient and schedule visit in ____weeks. ° °

## 2015-02-08 LAB — HM DIABETES EYE EXAM

## 2015-02-10 ENCOUNTER — Encounter: Payer: Self-pay | Admitting: Endocrinology

## 2015-02-10 ENCOUNTER — Ambulatory Visit (INDEPENDENT_AMBULATORY_CARE_PROVIDER_SITE_OTHER): Payer: BLUE CROSS/BLUE SHIELD | Admitting: Endocrinology

## 2015-02-10 VITALS — BP 108/66 | HR 50 | Temp 98.2°F | Resp 16 | Ht 73.5 in | Wt 190.4 lb

## 2015-02-10 DIAGNOSIS — Z23 Encounter for immunization: Secondary | ICD-10-CM

## 2015-02-10 DIAGNOSIS — E7800 Pure hypercholesterolemia, unspecified: Secondary | ICD-10-CM | POA: Diagnosis not present

## 2015-02-10 DIAGNOSIS — E1065 Type 1 diabetes mellitus with hyperglycemia: Secondary | ICD-10-CM | POA: Diagnosis not present

## 2015-02-10 NOTE — Patient Instructions (Signed)
Use a temporary basal down to even 10% when planning to be very active Make sure you have a protein at every meal Avoid bolusing for high readings during the night unless sugar is over 300

## 2015-02-10 NOTE — Progress Notes (Signed)
Patient ID: ERYC BODEY, male   DOB: 23-Mar-1966, 48 y.o.   MRN: [id number]   Chief complaint: Type 1 diabetes followup:   CURRENT insulin pump brand:  Medtronic 723  Prior history: He has a long-standing type 1 diabetes which is usually well-controlled Previously A1c readings have been as low as 5.8 although was having more hypoglycemia in the past  The pump SETTINGS are: Basal rate: 0.75 midnight- 11 AM, 11 AM = 0.6, 7:30 PM = 0.8 and 11 PM = 0.75 Carb Ratio: 1: 10 at breakfast, lunch = 1:14,  dinner = 1:10. Correction factor 1:40 with target 100 -110 and active insulin 4 hours, correction 1:50 at midnight,  HISTORY:   His blood sugar control is overall  well controlled and excellent A1c again at 6.1% He apparently has cut back on fatty foods since his last visit and he thinks he is taking less insulin especially overnight Although he has not been making changes recently in his basal rates the previously has done this when the blood sugars are high  GLUCOSE CONTROL with the pump is assessed today by download.  He is checking his blood sugar recently about 10x a day according to his monitor download  Following patterns and problems are present  He has somewhat variable blood sugars fasting but not consistently high  Overnight blood sugars are variable with some high and low readings; however has not had significantly low readings at bedtime since last Friday  If he takes any correction doses during the night he may occasionally get low in the morning  Blood sugars are still lower after breakfast even with changing his carbohydrate ratio  LOWEST blood sugars are before lunch  His readings are variable in the afternoon and probably overall the highest  Blood sugars before and after supper are reasonably well controlled usually  HYPOGLYCEMIA has been sporadic, mostly around lunchtime; frequently precipitated by increased activity  He is still not consistently  using the temporary basal rate for increased activity and may only reduce basal rate of 50%  He is bolusing frequently probably for snacks in between meals and also for high readings; has an average 9 manual boluses a day  GLUCOSE download results:  Mean values apply above for all meters except median for One Touch  PRE-MEAL Fasting Lunch Dinner Bedtime Overall  Glucose range:  50-317   50-153   59-185   38-239    Mean/median:  148   91   105   140     POST-MEAL PC Breakfast PC Lunch PC Dinner  Glucose range:        Mean/median:  93  109  155    EXERCISE: He is usually active with various activities during spring and summer or outside work  Hypoglycemia: He gets symptoms of confusion, feeling sleepy, anxious and at night will wake up with strange dreams and anxiety. Blood sugars may be in the 40s 50s when he has a low sugar   Lab Results  Component Value Date   HGBA1C 6.1 01/31/2015   HGBA1C 6.0 11/01/2014   HGBA1C 6.1 07/30/2014   Lab Results  Component Value Date   MICROALBUR <0.7 11/01/2014   LDLCALC 89 01/31/2015   CREATININE 0.95 01/31/2015       Medication List       This list is accurate as of: 02/10/15  1:51 PM.  Always use your most recent med list.  acetaminophen 325 MG tablet  Commonly known as:  TYLENOL  Take 650 mg by mouth every 6 (six) hours as needed.     ALIGN 4 MG Caps  Take 4 mg by mouth.     BAYER MICROLET LANCETS lancets  Use as instructed to check your blood sugar 10 times per day     folic acid 1 MG tablet  Commonly known as:  FOLVITE  Take 1 mg by mouth daily.     glucose blood test strip  Commonly known as:  BAYER CONTOUR NEXT TEST  Use as instructed to check blood sugars 10 times per day dx code E10.9     insulin aspart 100 UNIT/ML injection  Commonly known as:  novoLOG  Uses max 85 units per day in pump.     insulin glargine 100 UNIT/ML injection  Commonly known as:  LANTUS  Use 55 units daily      multivitamin tablet  Take 1 tablet by mouth daily.        Allergies: No Known Allergies  Past Medical History  Diagnosis Date  . Diabetes mellitus   . Cancer ([place])     No past surgical history on file.  Family History  Problem Relation Age of Onset  . Diabetes Paternal Aunt   . Heart disease Maternal Grandfather     Social History:  reports that he has never smoked. He does not have any smokeless tobacco history on file. He reports that he does not drink alcohol. His drug history is not on file.  REVIEW of systems:  No history of hypertension  Prior history of hematological malignancy, has normal CBC including platelets which are low normal  followed by hematologist   HYPERLIPIDEMIA: Minimal, LDL is now improved with his cutting back on meets   Lab Results  Component Value Date   CHOL 152 01/31/2015   HDL 53.70 01/31/2015   LDLCALC 89 01/31/2015   LDLDIRECT 100.0 12/18/2013   TRIG 50.0 01/31/2015   CHOLHDL 3 01/31/2015   He has occasional tingling in his feet which started after his chemotherapy  Diabetic foot in 9/15 showed  normal monofilament sensation in the toes and plantar surfaces, no skin lesions or ulcers on the feet and normal pedal pulses.  Had scaliness of his plantar surface distally and the large toes   EXAM:  BP 108/66 mmHg  Pulse 50  Temp(Src) 98.2 F (36.8 C)  Resp 16  Ht 6' 1.5" (1.867 m)  Wt 190 lb 6.4 oz (86.365 kg)  BMI 24.78 kg/m2  SpO2 98%   ASSESSMENT:  Type 1 diabetes, overall well controlled with A1c 6.1 % Current blood sugar patterns and problems identified are discussed in detail in  history of present illness Although he has reduce his insulin requirement with cutting back on higher fat meats he still has variability in his blood sugars at all time He is still having periodic hypoglycemia blood sugars occasionally below 40 and is not able to prevent these from happening both during the day and late at night Usually  postprandial readings are well controlled with occasional high readings at various times; not able to consistently judge his boluses with higher fat meals  He is a good candidate for continuous glucose monitoring especially with the new technology and threshold suspend Given him a brochure on the 630 Medtronic pump and he will look this over further and also discussed with nurse educator if needed  He will be needing to make some changes in  his pump settings as below to have better consistent control  Other instructions as an patient instruction section below  Changes recommended today: Basal rate changes: 10 AM = 0.6 Carbohydrate ratio 1:12 at breakfast  HYPERCHOLESTEROLEMIA: He is having better LDL control now with changing his diet He is still reluctant to consider a statin  Counseling time on subjects discussed above is over 50% of today's 25 minute visit  Patient Instructions  Use a temporary basal down to even 10% when planning to be very active Make sure you have a protein at every meal Avoid bolusing for high readings during the night unless sugar is over 300     Lossie Kalp

## 2015-02-14 ENCOUNTER — Telehealth: Payer: Self-pay | Admitting: Endocrinology

## 2015-02-14 NOTE — Telephone Encounter (Signed)
Pt had a very high over 500 bs level, ever since the Flu shot he has felt terrible. He is going to see if he can get a new pump he worries that could have a issue

## 2015-02-14 NOTE — Telephone Encounter (Signed)
Please see below.

## 2015-02-15 NOTE — Telephone Encounter (Signed)
Patient was called on 10/24 and he had brought his blood sugar back down to 85 with extra insulin including by injection and also changing his infusion set and using temporary basal increase.  He will call if he needs any further help

## 2015-02-17 ENCOUNTER — Encounter: Payer: Self-pay | Admitting: *Deleted

## 2015-02-25 ENCOUNTER — Ambulatory Visit (INDEPENDENT_AMBULATORY_CARE_PROVIDER_SITE_OTHER): Payer: BLUE CROSS/BLUE SHIELD | Admitting: Endocrinology

## 2015-02-25 DIAGNOSIS — E1065 Type 1 diabetes mellitus with hyperglycemia: Secondary | ICD-10-CM

## 2015-02-25 NOTE — Patient Instructions (Signed)
Glucose tabs for lows Extend bolus for hi fat meals with extra insulin

## 2015-02-25 NOTE — Progress Notes (Signed)
Patient ID: Corey Hudson, male   DOB: 12-27-1965, 49 y.o.   MRN: [id number]   Chief complaint: Type 1 diabetes followup:   CURRENT insulin pump brand:  Medtronic 723  Prior history: He has a long-standing type 1 diabetes which is usually well-controlled Previously A1c readings have been as low as 5.8 although was having more hypoglycemia in the past  The pump SETTINGS are: Basal rate: 0.75 midnight- 11 AM, 11 AM = 0.6, 7:30 PM = 0.8 and 11 PM = 0.75 Carb Ratio: 1: 10 at breakfast, lunch = 1:14,  dinner = 1:10. Correction factor 1:40 with target 100 -110 and active insulin 4 hours, correction 1:50 at midnight,  HISTORY:   The patient was seen as a walk-in for recent difficulties in getting blood sugar control consistently and marked variability in blood sugars  GLUCOSE CONTROL with the pump is assessed today by download.  He is checking his blood sugar recently about 10x a day according to his monitor download  Following patterns and problems are present  He has been having periodic hypoglycemia at various times of the day; this sugars about once a day at various times except overnight  Occasionally hypoglycemia has proceeded by a bolus for correcting a high reading including postprandial; he tends to aggressively bolus for relatively high readings including after meals.  He is treating his low blood sugars with either juice or [facility] which he can probably drink 10-16 ounces at a time which tends to cause rebound hyperglycemia also  Last evening his blood sugar had gone up to 400+ after dinner and after 4 successive boluses his blood sugar went down to 37 at bedtime  Hyperglycemia: This occurs mostly overnight although on some nights his blood sugars are relatively better.  More recently he has been traveling very he had little choice and what he could eat and with high-fat meals his blood sugars would be significantly higher late at night because of not using  extended bolus or adding extra insulin   Usually not getting higher blood sugars after breakfast or lunch but may have higher readings before dinner at times  He is asking about where he can use the infusion sets as he is getting from our areas in the middle of the abdomen area  He has been overriding his pump about 37% of the time  GLUCOSE download results: Recent average blood sugar 138+/-95 with 47% readings above and 19% below target  EXERCISE: He is usually active with various activities during spring and summer or outside work  Hypoglycemia: He gets symptoms of confusion, feeling sleepy, anxious and at night will wake up with strange dreams and anxiety. Blood sugars may be in the 40s 50s when he has a low sugar   Lab Results  Component Value Date   HGBA1C 6.1 01/31/2015   HGBA1C 6.0 11/01/2014   HGBA1C 6.1 07/30/2014   Lab Results  Component Value Date   MICROALBUR <0.7 11/01/2014   LDLCALC 89 01/31/2015   CREATININE 0.95 01/31/2015       Medication List       This list is accurate as of: 02/25/15 11:59 PM.  Always use your most recent med list.               acetaminophen 325 MG tablet  Commonly known as:  TYLENOL  Take 650 mg by mouth every 6 (six) hours as needed.     ALIGN 4 MG Caps  Take 4 mg by  mouth.     BAYER MICROLET LANCETS lancets  Use as instructed to check your blood sugar 10 times per day     folic acid 1 MG tablet  Commonly known as:  FOLVITE  Take 1 mg by mouth daily.     glucose blood test strip  Commonly known as:  BAYER CONTOUR NEXT TEST  Use as instructed to check blood sugars 10 times per day dx code E10.9     insulin aspart 100 UNIT/ML injection  Commonly known as:  novoLOG  Uses max 85 units per day in pump.     insulin glargine 100 UNIT/ML injection  Commonly known as:  LANTUS  Use 55 units daily     multivitamin tablet  Take 1 tablet by mouth daily.        Allergies: No Known Allergies  Past Medical History    Diagnosis Date  . Diabetes mellitus   . Cancer ([place])     No past surgical history on file.  Family History  Problem Relation Age of Onset  . Diabetes Paternal Aunt   . Heart disease Maternal Grandfather     Social History:  reports that he has never smoked. He does not have any smokeless tobacco history on file. He reports that he does not drink alcohol. His drug history is not on file.  REVIEW of systems:  Below is a copy of previous progress note  Prior history of hematological malignancy, has normal CBC including platelets which are low normal  followed by hematologist   HYPERLIPIDEMIA: Minimal, LDL is now improved with his cutting back on meets   Lab Results  Component Value Date   CHOL 152 01/31/2015   HDL 53.70 01/31/2015   LDLCALC 89 01/31/2015   LDLDIRECT 100.0 12/18/2013   TRIG 50.0 01/31/2015   CHOLHDL 3 01/31/2015   He has occasional tingling in his feet which started after his chemotherapy  Diabetic foot in 9/15 showed  normal monofilament sensation in the toes and plantar surfaces, no skin lesions or ulcers on the feet and normal pedal pulses.  Had scaliness of his plantar surface distally and the large toes   EXAM:  There were no vitals taken for this visit.   ASSESSMENT:  Type 1 diabetes, usually overall well controlled with A1c 6.1 % recently See history of present illness for detailed discussion of his current management, blood sugar patterns and problems identified  Today discussed day-to-day management of his pump and discussed appropriate treatment of hypoglycemia as well as prevention of hypoglycemia especially with meals  He thinks that most of his hyperglycemia is recently from eating higher fat meals when traveling which he will be doing less often now; discussed needing to add extra insulin and extended bolus for 3-4 hours for higher fat meals Also not clear if he needs higher basal rate during the night as he does have some good readings  on some of his nights when he checks his blood sugar Blood sugar went up last night possibly from rebounding from low blood sugar before his evening meal and then overtreating this as well as having a higher carbohydrate meal.  His hypoglycemia is somewhat unpredictable and discussed avoiding overaggressive correction of high postprandial readings which tends to cause hypoglycemia Also discussed that treatment of hypoglycemia should be with only 15-20 g of carbohydrate using smaller amounts of juice or regular soft drinks  but preferably take 3-4 glucose tablets as he tends to over treat been drinking sweet drinks  No changes in pump settings at this time  Try to rotate infusion sites to the sides of his abdomen  Patient Instructions  Glucose tabs for lows Extend bolus for hi fat meals with extra insulin     Izzabella Besse

## 2015-03-31 ENCOUNTER — Telehealth: Payer: Self-pay | Admitting: Endocrinology

## 2015-03-31 ENCOUNTER — Other Ambulatory Visit: Payer: Self-pay | Admitting: *Deleted

## 2015-03-31 MED ORDER — INSULIN ASPART 100 UNIT/ML ~~LOC~~ SOLN: SUBCUTANEOUS | Status: DC

## 2015-03-31 NOTE — Telephone Encounter (Signed)
Please fax in novolog rx to [facility]

## 2015-03-31 NOTE — Telephone Encounter (Signed)
rx sent

## 2015-05-10 ENCOUNTER — Other Ambulatory Visit (INDEPENDENT_AMBULATORY_CARE_PROVIDER_SITE_OTHER): Payer: BLUE CROSS/BLUE SHIELD

## 2015-05-10 DIAGNOSIS — E1065 Type 1 diabetes mellitus with hyperglycemia: Secondary | ICD-10-CM | POA: Diagnosis not present

## 2015-05-10 LAB — BASIC METABOLIC PANEL
BUN: 17 mg/dL (ref 6–23)
CALCIUM: 8.8 mg/dL (ref 8.4–10.5)
CO2: 29 mEq/L (ref 19–32)
CREATININE: 1.06 mg/dL (ref 0.40–1.50)
Chloride: 102 mEq/L (ref 96–112)
GFR: 78.51 mL/min (ref >60.00)
GLUCOSE: 240 mg/dL — AB (ref 70–99)
Potassium: 4.1 mEq/L (ref 3.5–5.1)
Sodium: 137 mEq/L (ref 135–145)

## 2015-05-10 LAB — HEMOGLOBIN A1C: Hgb A1c MFr Bld: 5.9 % (ref 4.6–6.5)

## 2015-05-13 ENCOUNTER — Encounter: Payer: Self-pay | Admitting: Endocrinology

## 2015-05-13 ENCOUNTER — Ambulatory Visit (INDEPENDENT_AMBULATORY_CARE_PROVIDER_SITE_OTHER): Payer: BLUE CROSS/BLUE SHIELD | Admitting: Endocrinology

## 2015-05-13 VITALS — BP 122/80 | HR 65 | Temp 98.5°F | Resp 14 | Ht 73.5 in | Wt 194.0 lb

## 2015-05-13 DIAGNOSIS — E1065 Type 1 diabetes mellitus with hyperglycemia: Secondary | ICD-10-CM | POA: Diagnosis not present

## 2015-05-13 NOTE — Progress Notes (Signed)
Patient ID: Corey Hudson, male   DOB: 05/28/1965, 50 y.o.   MRN: [id number]   Chief complaint: Type 1 diabetes followup:   CURRENT insulin pump brand:  Medtronic 723  Prior history: He has a long-standing type 1 diabetes which is usually well-controlled Previously A1c readings have been as low as 5.8 although was having more hypoglycemia in the past  The pump SETTINGS are: Basal rate: 0.75 midnight- 11 AM, 11 AM = 0.6, 7:30 PM = 0.8 and 11 PM = 0.75 Carb Ratio: 1: 10 at breakfast, lunch = 1:14,  dinner = 1:10. Correction factor 1:40 with target 100 -110 and active insulin 4 hours, correction 1:50 at midnight,  HISTORY:   His A1c generally is upper normal, now 5.9%  GLUCOSE CONTROL with the pump is assessed today by download.  He is checking his blood sugar recently about 9x a day according to his monitor download  Following patterns and problems are present  He has been having more frequent hypoglycemia recently in the early mornings, midday and occasionally after lunch  Blood sugars are variable in the afternoons and evenings  HYPERGLYCEMIA is present mostly during the night when he checks blood sugars around 3-4 AM  Fasting blood sugars are overall fairly good but generally low normal and has woken up with mild hypoglycemia couple of times  POSTPRANDIAL blood sugars are relatively lower after lunch especially recently; once had a low sugar because of overestimating how much he needed to bolus at a restaurant  He is generally counting carbohydrates fairly well but may not always eating what he is planning to  He still has not looked into getting the new insulin pump  Changing infusion set about every 3-4 days, he says that if he waits longer his blood sugars will go up to 37 under that   He has been overriding his pump about 34 % of the time  GLUCOSE download results: Recent download shows 39 % readings above and 14 % below target   Mean values apply above  for all meters except median for One Touch  PRE-MEAL Fasting Lunch Dinner Bedtime Overall  Glucose range:  64-243   60-255  61-196   69-134    Mean/median:  118   109   102   137   128+/-60    POST-MEAL PC Breakfast PC Lunch PC Dinner  Glucose range:     Mean/median:  144   112   124    EXERCISE: He is usually active with various activities and variable level of activity at work  Hypoglycemia: He gets symptoms of confusion, feeling sleepy, anxious and at night will wake up with strange dreams and anxiety. Blood sugars may be in the 40s 50s when he has a low sugar, has been advised to use glucose tablets to treat symptoms   Lab Results  Component Value Date   HGBA1C 5.9 05/10/2015   HGBA1C 6.1 01/31/2015   HGBA1C 6.0 11/01/2014   Lab Results  Component Value Date   MICROALBUR <0.7 11/01/2014   LDLCALC 89 01/31/2015   CREATININE 1.06 05/10/2015       Medication List       This list is accurate as of: 05/13/15  9:29 AM.  Always use your most recent med list.               acetaminophen 325 MG tablet  Commonly known as:  TYLENOL  Take 650 mg by mouth every 6 (six) hours as  needed.     ALIGN 4 MG Caps  Take 4 mg by mouth.     BAYER MICROLET LANCETS lancets  Use as instructed to check your blood sugar 10 times per day     folic acid 1 MG tablet  Commonly known as:  FOLVITE  Take 1 mg by mouth daily.     glucose blood test strip  Commonly known as:  BAYER CONTOUR NEXT TEST  Use as instructed to check blood sugars 10 times per day dx code E10.9     insulin aspart 100 UNIT/ML injection  Commonly known as:  novoLOG  Uses max 85 units per day in pump.     insulin glargine 100 UNIT/ML injection  Commonly known as:  LANTUS  Use 55 units daily     multivitamin tablet  Take 1 tablet by mouth daily.        Allergies: No Known Allergies  Past Medical History  Diagnosis Date  . Diabetes mellitus   . Cancer ([place])     No past surgical history on  file.  Family History  Problem Relation Age of Onset  . Diabetes Paternal Aunt   . Heart disease Maternal Grandfather     Social History:  reports that he has never smoked. He does not have any smokeless tobacco history on file. He reports that he does not drink alcohol. His drug history is not on file.  REVIEW of systems:   Prior history of hematological malignancy, has normal CBC including platelets which are low normal  followed by hematologist   HYPERLIPIDEMIA: Minimal, LDL is 89   Lab Results  Component Value Date   CHOL 152 01/31/2015   HDL 53.70 01/31/2015   LDLCALC 89 01/31/2015   LDLDIRECT 100.0 12/18/2013   TRIG 50.0 01/31/2015   CHOLHDL 3 01/31/2015   He has occasional tingling in his feet which started after his chemotherapy, not any worse  Diabetic foot in 9/15 showed  normal monofilament sensation in the toes and plantar surfaces, no skin lesions or ulcers on the feet and normal pedal pulses.  Had scaliness of his plantar surface distally and the large toes   EXAM:  BP 122/80 mmHg  Pulse 65  Temp(Src) 98.5 F (36.9 C)  Resp 14  Ht 6' 1.5" (1.867 m)  Wt 194 lb (87.998 kg)  BMI 25.25 kg/m2  SpO2 97%   ASSESSMENT:  Type 1 diabetes, usually overall well controlled with A1c 5.9 now and generally upper normal See history of present illness for detailed discussion of his current management, blood sugar patterns and problems identified  He is fairly compliant with managing his diabetes with the pump including glucose monitoring, bolusing consistently, using temporary basal when he is more active and entering carbohydrates for meals  He tends to be relatively aggressive in managing his diabetes and tends to have hypoglycemia periodically which appears to be more often this week early morning until early afternoon This is likely to be from excessive basal rate early morning and excessive bolusing at lunch  Today discussed trying to bolus sometimes right  after eating if he is not sure how much he will eat He was also given information on the new pumps that will be available including 670 G  BASAL rate changes: 0.00 = 0.85.:  30 a.m. = 0.70, 5 AM = 0.65 and carbohydrate ratio at lunch 1:16 He can continue to adjust the settings further based on his blood sugar patterns Call if having excessive low sugars  Overall snacks with boluses  Counseling time on subjects discussed above is over 50% of today's 25 minute visit   There are no Patient Instructions on file for this visit.   Flossie Wexler

## 2015-06-06 ENCOUNTER — Telehealth: Payer: Self-pay | Admitting: Endocrinology

## 2015-06-06 NOTE — Telephone Encounter (Signed)
Pt wanted to leave a message for you regarding a new pump Dr. Dwyane Dee wants him to start.  He has several questions about starting this.

## 2015-06-07 NOTE — Telephone Encounter (Signed)
Phoned Corey Hudson to discuss the benefits of the new Medtronic pump and sensor.  Discussed upgrades in the sensor's accuracy, and he said he will think about it.  He also questioned when the new personal Elenor Legato will be coming out.  He was told to call Medtr[employer] if he wants to upgrade his pump. He said he will think about it.

## 2015-07-07 ENCOUNTER — Other Ambulatory Visit: Payer: Self-pay | Admitting: *Deleted

## 2015-07-07 MED ORDER — GLUCOSE BLOOD VI STRP: ORAL_STRIP | Status: DC

## 2015-08-04 ENCOUNTER — Other Ambulatory Visit: Payer: Self-pay | Admitting: Endocrinology

## 2015-08-04 ENCOUNTER — Telehealth: Payer: Self-pay | Admitting: Endocrinology

## 2015-08-04 NOTE — Telephone Encounter (Signed)
Lantus needs to be called in [place] will be getting him a new pump next week but in the mean time he needs to have lantus refilled and have clear instruction please

## 2015-08-04 NOTE — Telephone Encounter (Signed)
rx was sent

## 2015-08-04 NOTE — Telephone Encounter (Signed)
Pt would like a call back, he has questions also about his Lantus

## 2015-08-04 NOTE — Telephone Encounter (Signed)
[facility] note dated 08/04/15 at 12:10 AM Insulin pump stopped working Caller states went to put new reservoir on, started to prime it, it squirted insulin in a steady stream and it wouldn't stop. Has lantus took 18 units. Uses novolog in pump. Cant get into pump to get basal rate. Found old paper that had basal rate and based dose on that. [employer] called but cant get thru. BS is 230 before took insulin from pump at 10:36.

## 2015-08-08 ENCOUNTER — Other Ambulatory Visit (INDEPENDENT_AMBULATORY_CARE_PROVIDER_SITE_OTHER): Payer: BLUE CROSS/BLUE SHIELD

## 2015-08-08 DIAGNOSIS — E1065 Type 1 diabetes mellitus with hyperglycemia: Secondary | ICD-10-CM

## 2015-08-08 LAB — COMPREHENSIVE METABOLIC PANEL
ALK PHOS: 56 U/L (ref 39–117)
ALT: 20 U/L (ref 0–53)
AST: 24 U/L (ref 0–37)
Albumin: 4.2 g/dL (ref 3.5–5.2)
BUN: 14 mg/dL (ref 6–23)
CHLORIDE: 103 meq/L (ref 96–112)
CO2: 30 mEq/L (ref 19–32)
Calcium: 9.6 mg/dL (ref 8.4–10.5)
Creatinine, Ser: 1.05 mg/dL (ref 0.40–1.50)
GFR: 79.3 mL/min (ref >60.00)
GLUCOSE: 73 mg/dL (ref 70–99)
POTASSIUM: 4.3 meq/L (ref 3.5–5.1)
SODIUM: 141 meq/L (ref 135–145)
Total Bilirubin: 0.8 mg/dL (ref 0.2–1.2)
Total Protein: 6.4 g/dL (ref 6.0–8.3)

## 2015-08-08 LAB — LIPID PANEL
CHOLESTEROL: 164 mg/dL (ref 0–200)
HDL: 58.3 mg/dL (ref >39.00)
LDL Cholesterol: 96 mg/dL (ref 0–99)
NonHDL: 105.63
Total CHOL/HDL Ratio: 3
Triglycerides: 47 mg/dL (ref 0.0–149.0)
VLDL: 9.4 mg/dL (ref 0.0–40.0)

## 2015-08-08 LAB — MICROALBUMIN / CREATININE URINE RATIO
Creatinine,U: 121.4 mg/dL
MICROALB/CREAT RATIO: 0.6 mg/g (ref 0.0–30.0)
Microalb, Ur: <0.7 mg/dL (ref 0.0–1.9)

## 2015-08-08 LAB — HEMOGLOBIN A1C: HEMOGLOBIN A1C: 6.2 % (ref 4.6–6.5)

## 2015-08-11 ENCOUNTER — Ambulatory Visit: Payer: BLUE CROSS/BLUE SHIELD | Admitting: Endocrinology

## 2015-08-12 ENCOUNTER — Ambulatory Visit (INDEPENDENT_AMBULATORY_CARE_PROVIDER_SITE_OTHER): Payer: BLUE CROSS/BLUE SHIELD | Admitting: Endocrinology

## 2015-08-12 ENCOUNTER — Encounter: Payer: Self-pay | Admitting: Endocrinology

## 2015-08-12 VITALS — BP 118/70 | HR 64 | Temp 98.0°F | Resp 14 | Ht 73.5 in | Wt 186.4 lb

## 2015-08-12 DIAGNOSIS — E1065 Type 1 diabetes mellitus with hyperglycemia: Secondary | ICD-10-CM | POA: Diagnosis not present

## 2015-08-12 NOTE — Progress Notes (Signed)
Patient ID: Corey Hudson, male   DOB: 1965/07/06, 50 y.o.   MRN: [id number]   Chief complaint: Type 1 diabetes followup:   CURRENT insulin pump brand:  Medtronic 723  Prior history: He has a long-standing type 1 diabetes which is usually well-controlled Previously A1c readings have been as low as 5.8 although was having more hypoglycemia in the past  HISTORY:   The pump SETTINGS are: Basal rate: 0.85 midnight-2:30 a.m.,2:30 a.m. = 0.70, 5 AM = 0.65  11 AM = 0.6, 7:30 PM = 0.75  Carb Ratio: 1: 10 at breakfast, lunch = 1:16,  dinner = 1:10. Correction factor 1:40 with target 100 -110 and active insulin 4 hours, correction 1:50 at midnight,  His A1c generally is upper normal, now 6.2, previously 5.9%  GLUCOSE CONTROL with the pump is not assessed today since he just got a new insulin pump and has not been putting his blood sugars in the pump with not blinking his meter and also his readings are on 2 different glucose monitor is now  His previous pump malfunctioned and he is using the same pump now Has not been able to find out about getting an upgrade to the new version as yet  Following patterns and problems are present He has not been having any low sugars waking up as before, fasting glucose was 71 today  Also he does not think his blood sugars after midnight are consistently high when he checks them periodically as before with increasing his basal rate on the last visit   EXERCISE: He is usually active with various activities and variable level of activity at work  Hypoglycemia: He gets symptoms of confusion, feeling sleepy, anxious and at night will wake up with strange dreams and anxiety. Blood sugars may be in the 40s 50s when he has a low sugar, has been advised to use glucose tablets to treat symptoms   Lab Results  Component Value Date   HGBA1C 6.2 08/08/2015   HGBA1C 5.9 05/10/2015   HGBA1C 6.1 01/31/2015   Lab Results  Component Value Date   MICROALBUR  <0.7 08/08/2015   LDLCALC 96 08/08/2015   CREATININE 1.05 08/08/2015       Medication List       This list is accurate as of: 08/12/15  9:55 AM.  Always use your most recent med list.               acetaminophen 325 MG tablet  Commonly known as:  TYLENOL  Take 650 mg by mouth every 6 (six) hours as needed.     ALIGN 4 MG Caps  Take 4 mg by mouth.     BAYER MICROLET LANCETS lancets  Use as instructed to check your blood sugar 10 times per day     folic acid 1 MG tablet  Commonly known as:  FOLVITE  Take 1 mg by mouth daily.     glucose blood test strip  Commonly known as:  BAYER CONTOUR NEXT TEST  Use as instructed to check blood sugars 10 times per day dx code E10.9     insulin aspart 100 UNIT/ML injection  Commonly known as:  novoLOG  Uses max 85 units per day in pump.     LANTUS 100 UNIT/ML injection  Generic drug:  insulin glargine  USE AS DIRECTED UP TO 55 UNITS DAILY     multivitamin tablet  Take 1 tablet by mouth daily.        Allergies:  No Known Allergies  Past Medical History  Diagnosis Date  . Diabetes mellitus   . Cancer ([place])     No past surgical history on file.  Family History  Problem Relation Age of Onset  . Diabetes Paternal Aunt   . Heart disease Maternal Grandfather     Social History:  reports that he has never smoked. He does not have any smokeless tobacco history on file. He reports that he does not drink alcohol. His drug history is not on file.  REVIEW of systems:   Prior history of hematological malignancy, has normal CBC including platelets which are low normal  followed by hematologist   HYPERLIPIDEMIA: Minimal, LDL is 89   Lab Results  Component Value Date   CHOL 164 08/08/2015   HDL 58.30 08/08/2015   LDLCALC 96 08/08/2015   LDLDIRECT 100.0 12/18/2013   TRIG 47.0 08/08/2015   CHOLHDL 3 08/08/2015   He has occasional tingling in his feet which started after his chemotherapy  Diabetic foot in 4/17 was  normal   EXAM:  BP 118/70 mmHg  Pulse 64  Temp(Src) 98 F (36.7 C)  Resp 14  Ht 6' 1.5" (1.867 m)  Wt 186 lb 6.4 oz (84.55 kg)  BMI 24.26 kg/m2  SpO2 97%  Diabetic Foot Exam - Simple   Simple Foot Form  Diabetic Foot exam was performed with the following findings:  Yes   Visual Inspection  No deformities, no ulcerations, no other skin breakdown bilaterally:  Yes  See comments:  Yes  Sensation Testing  Intact to touch and monofilament testing bilaterally:  Yes  See comments:  Yes  Pulse Check  Posterior Tibialis and Dorsalis pulse intact bilaterally:  Yes  Comments   Has scaliness of his plantar surface distally possibly causing relatively decreased sensitivity        ASSESSMENT:  Type 1 diabetes, usually overall well controlled with A1c 6.2 now As discussed above he thinks his blood sugars are well controlled but unable to objectively assess his blood sugar patterns and management because of his not having any data on his pump or new glucose monitor He was shown the [facility] USP device and he will upload his pump next week for review and will discuss management over the phone next week Meanwhile will continue the same settings He will look into the 670 G insulin pump also  Follow-up in 3 weeks  Patient Instructions  Download pump and send readings in week on [facility]       [facility]

## 2015-08-12 NOTE — Patient Instructions (Addendum)
Download pump and send readings in week on [facility]

## 2015-08-15 ENCOUNTER — Telehealth: Payer: Self-pay | Admitting: Endocrinology

## 2015-08-15 NOTE — Telephone Encounter (Signed)
Can the pt just come in a couple of weeks to have his meter read he does not have the device? Dr. Dwyane Dee was speaking of?

## 2015-08-15 NOTE — Telephone Encounter (Signed)
He can bring his meter in to be read if that's what you mean?

## 2015-09-16 DIAGNOSIS — Z6825 Body mass index (BMI) 25.0-25.9, adult: Secondary | ICD-10-CM | POA: Diagnosis not present

## 2015-09-16 DIAGNOSIS — M546 Pain in thoracic spine: Secondary | ICD-10-CM | POA: Diagnosis not present

## 2015-09-27 ENCOUNTER — Telehealth: Payer: Self-pay | Admitting: Nutrition

## 2015-09-27 NOTE — Telephone Encounter (Signed)
Message left at work to have patient call me today.  (Cell phone not able to leave a message)

## 2015-09-28 DIAGNOSIS — E109 Type 1 diabetes mellitus without complications: Secondary | ICD-10-CM | POA: Diagnosis not present

## 2015-10-05 DIAGNOSIS — E109 Type 1 diabetes mellitus without complications: Secondary | ICD-10-CM | POA: Diagnosis not present

## 2015-10-07 ENCOUNTER — Other Ambulatory Visit: Payer: Self-pay | Admitting: Endocrinology

## 2015-10-31 ENCOUNTER — Other Ambulatory Visit (INDEPENDENT_AMBULATORY_CARE_PROVIDER_SITE_OTHER): Payer: BLUE CROSS/BLUE SHIELD

## 2015-10-31 DIAGNOSIS — E1065 Type 1 diabetes mellitus with hyperglycemia: Secondary | ICD-10-CM

## 2015-10-31 LAB — MICROALBUMIN / CREATININE URINE RATIO
Creatinine,U: 76.5 mg/dL
MICROALB/CREAT RATIO: 0.9 mg/g (ref 0.0–30.0)
Microalb, Ur: <0.7 mg/dL (ref 0.0–1.9)

## 2015-10-31 LAB — COMPREHENSIVE METABOLIC PANEL
ALK PHOS: 57 U/L (ref 39–117)
ALT: 24 U/L (ref 0–53)
AST: 41 U/L — ABNORMAL HIGH (ref 0–37)
Albumin: 4.2 g/dL (ref 3.5–5.2)
BUN: 15 mg/dL (ref 6–23)
CHLORIDE: 100 meq/L (ref 96–112)
CO2: 31 meq/L (ref 19–32)
Calcium: 9.5 mg/dL (ref 8.4–10.5)
Creatinine, Ser: 1.09 mg/dL (ref 0.40–1.50)
GFR: 75.88 mL/min (ref >60.00)
GLUCOSE: 166 mg/dL — AB (ref 70–99)
POTASSIUM: 3.9 meq/L (ref 3.5–5.1)
Sodium: 135 mEq/L (ref 135–145)
Total Bilirubin: 1.1 mg/dL (ref 0.2–1.2)
Total Protein: 6.4 g/dL (ref 6.0–8.3)

## 2015-10-31 LAB — HEMOGLOBIN A1C: HEMOGLOBIN A1C: 6 % (ref 4.6–6.5)

## 2015-11-02 ENCOUNTER — Other Ambulatory Visit: Payer: BLUE CROSS/BLUE SHIELD

## 2015-11-08 ENCOUNTER — Encounter: Payer: Self-pay | Admitting: Endocrinology

## 2015-11-09 ENCOUNTER — Encounter: Payer: Self-pay | Admitting: Endocrinology

## 2015-11-09 ENCOUNTER — Ambulatory Visit (INDEPENDENT_AMBULATORY_CARE_PROVIDER_SITE_OTHER): Payer: BLUE CROSS/BLUE SHIELD | Admitting: Endocrinology

## 2015-11-09 VITALS — BP 112/72 | HR 60 | Ht 73.5 in | Wt 198.0 lb

## 2015-11-09 DIAGNOSIS — E1065 Type 1 diabetes mellitus with hyperglycemia: Secondary | ICD-10-CM | POA: Diagnosis not present

## 2015-11-09 NOTE — Progress Notes (Signed)
Patient ID: Corey Hudson, male   DOB: 02-07-1966, 50 y.o.   MRN: [id number]   Chief complaint: Type 1 diabetes followup:   CURRENT insulin pump brand:  Medtronic 723  Prior history: He has a long-standing type 1 diabetes which is usually well-controlled Previously A1c readings have been as low as 5.8 although was having more hypoglycemia in the past  HISTORY:   The pump SETTINGS are: Basal rate: 0.85 midnight-2:30 a.m.,2:30 a.m. = 0.70, 5 AM = 0.65  11 AM = 0.6, 7:30 PM = 0.75   Carb Ratio: 1: 10 at breakfast, lunch = 1:16,  dinner = 1:10.  Correction factor 1:40 during the day and 1:50 at night with target 100 -110 and active insulin 4 hours    His A1c generally is upper normal, now 6.0, previously 6.2  GLUCOSE CONTROL with the pump   Mean values apply above for all meters except median for One Touch  PRE-MEAL Fasting Lunch Dinner Bedtime Overall  Glucose range: 63-220  55-112  52-159  41-263    Mean/median: 130  98  94  150  121+/-56    POST-MEAL PC Breakfast PC Lunch PC Dinner  Glucose range:      Mean/median: 110  125 104     Following patterns and problems are present   Although his overall blood sugars are on an average excellent he has had some variability  Highest readings are during the night when he checks them around 2-3 AM frequently requiring correction boluses  FASTING blood sugars are generally near target although recently has a couple of 60s.  Lowest blood sugars are overall before lunch and these are fairly consistent  Also on an average blood sugars are relatively low before suppertime although some of these may be related to extra boluses in the afternoon  He may tend to get low blood sugars when he is more active, his work involves some physical activity only about half the time  POSTPRANDIAL readings are relatively tightly controlled after breakfast and supper but not as much after lunch; using the lowest carbohydrate coverage at  lunchtime  Checking blood sugars up to 13 times a day and having blood sugars above target 28% of the time and below target 13% of the time  EXERCISE: He is usually active with various activities and variable level of activity at work  Hypoglycemia: He gets symptoms of confusion, feeling sleepy, anxious and at night will wake up with strange dreams and anxiety. Blood sugars may be in the 40s 50s when he has a low sugar, has been advised to use glucose tablets to treat symptoms   Lab Results  Component Value Date   HGBA1C 6.0 10/31/2015   HGBA1C 6.2 08/08/2015   HGBA1C 5.9 05/10/2015   Lab Results  Component Value Date   MICROALBUR <0.7 10/31/2015   [place] 96 08/08/2015   CREATININE 1.09 10/31/2015       Medication List       This list is accurate as of: 11/09/15 10:50 AM.  Always use your most recent med list.               acetaminophen 325 MG tablet  Commonly known as:  TYLENOL  Take 650 mg by mouth every 6 (six) hours as needed. Reported on 11/09/2015     ALIGN 4 MG Caps  Take 4 mg by mouth.     BAYER MICROLET LANCETS lancets  Use as instructed to check your blood sugar 10  times per day     folic acid 1 MG tablet  Commonly known as:  FOLVITE  Take 1 mg by mouth daily.     glucose blood test strip  Commonly known as:  BAYER CONTOUR NEXT TEST  Use as instructed to check blood sugars 10 times per day dx code E10.9     LANTUS 100 UNIT/ML injection  Generic drug:  insulin glargine  USE AS DIRECTED UP TO 55 UNITS DAILY     multivitamin tablet  Take 1 tablet by mouth daily.     NOVOLOG 100 UNIT/ML injection  Generic drug:  insulin aspart  USE MAX OF 85 UNITS DAILY IN PUMP        Allergies: No Known Allergies  Past Medical History  Diagnosis Date  . Diabetes mellitus   . Cancer ([place])     No past surgical history on file.  Family History  Problem Relation Age of Onset  . Diabetes Paternal Aunt   . Heart disease Maternal Grandfather     Social  History:  reports that he has never smoked. He does not have any smokeless tobacco history on file. He reports that he does not drink alcohol. His drug history is not on file.  REVIEW of systems:   Prior history of hematological malignancy, has normal CBC including platelets which are low normal  followed by hematologist   HYPERLIPIDEMIA: Minimal, LDL is Below 100 more recently   Lab Results  Component Value Date   CHOL 164 08/08/2015   HDL 58.30 08/08/2015   LDLCALC 96 08/08/2015   LDLDIRECT 100.0 12/18/2013   TRIG 47.0 08/08/2015   CHOLHDL 3 08/08/2015   He has occasional tingling in his feet which started after his chemotherapy, Stable  Diabetic foot in 4/17 was normal   EXAM:  BP 112/72 mmHg  Pulse 60  Ht 6' 1.5" (1.867 m)  Wt 198 lb (89.812 kg)  BMI 25.77 kg/m2  SpO2 98%  ASSESSMENT:  Type 1 diabetes, usually overall well controlled with A1c 6 recently See history of present illness for detailed discussion of current diabetes management, blood sugar patterns and problems identified His blood sugars are other tightly controlled with tendency to hypoglycemia almost daily but this is that in consistent times Usually not having low sugars during the night and mostly has somewhat high readings around 2-3 AM  Preprandial blood sugars are relatively low at lunch and supper Also he has probably excessive carbohydrate coverage at breakfast and supper with postprandial readings about the same on an average before and after eating  Discussed how he will be transitioned to the 670 insulin pump He does need to set up his [facility] and this will be done with help the nurse educator He will drop his basal rate between 10 AM-7:30 PM down to 0.5 to reduce hypoglycemia Also carbohydrate coverage to be 1:12 at breakfast and supper Most likely will need higher carbohydrate coverage with starting the sixth 70 pump  Follow-up in 3 weeks  There are no Patient Instructions on file for  this visit.  Counseling time on subjects discussed above is over 50% of today's 25 minute visit  [name]

## 2015-11-10 ENCOUNTER — Other Ambulatory Visit: Payer: Self-pay | Admitting: Endocrinology

## 2015-11-10 DIAGNOSIS — Z0000 Encounter for general adult medical examination without abnormal findings: Secondary | ICD-10-CM | POA: Diagnosis not present

## 2015-11-10 DIAGNOSIS — Z125 Encounter for screening for malignant neoplasm of prostate: Secondary | ICD-10-CM | POA: Diagnosis not present

## 2015-11-10 DIAGNOSIS — E109 Type 1 diabetes mellitus without complications: Secondary | ICD-10-CM | POA: Diagnosis not present

## 2015-11-10 NOTE — Telephone Encounter (Signed)
Refilled

## 2015-11-10 NOTE — Telephone Encounter (Signed)
Pt needs [place] to call into [facility] for the refill of the novolog insulin asap

## 2015-11-16 DIAGNOSIS — M546 Pain in thoracic spine: Secondary | ICD-10-CM | POA: Diagnosis not present

## 2015-11-16 DIAGNOSIS — Z125 Encounter for screening for malignant neoplasm of prostate: Secondary | ICD-10-CM | POA: Diagnosis not present

## 2015-11-16 DIAGNOSIS — Z1389 Encounter for screening for other disorder: Secondary | ICD-10-CM | POA: Diagnosis not present

## 2015-11-16 DIAGNOSIS — Z0000 Encounter for general adult medical examination without abnormal findings: Secondary | ICD-10-CM | POA: Diagnosis not present

## 2015-11-16 DIAGNOSIS — E109 Type 1 diabetes mellitus without complications: Secondary | ICD-10-CM | POA: Diagnosis not present

## 2015-11-16 DIAGNOSIS — Z6826 Body mass index (BMI) 26.0-26.9, adult: Secondary | ICD-10-CM | POA: Diagnosis not present

## 2015-11-18 DIAGNOSIS — Z1212 Encounter for screening for malignant neoplasm of rectum: Secondary | ICD-10-CM | POA: Diagnosis not present

## 2015-11-21 DIAGNOSIS — Z794 Long term (current) use of insulin: Secondary | ICD-10-CM | POA: Diagnosis not present

## 2015-11-21 DIAGNOSIS — E109 Type 1 diabetes mellitus without complications: Secondary | ICD-10-CM | POA: Diagnosis not present

## 2015-11-23 ENCOUNTER — Telehealth: Payer: Self-pay | Admitting: Endocrinology

## 2015-11-30 ENCOUNTER — Ambulatory Visit (INDEPENDENT_AMBULATORY_CARE_PROVIDER_SITE_OTHER): Payer: BLUE CROSS/BLUE SHIELD | Admitting: Endocrinology

## 2015-11-30 ENCOUNTER — Encounter: Payer: Self-pay | Admitting: Endocrinology

## 2015-11-30 VITALS — BP 112/62 | HR 52 | Ht 72.0 in | Wt 192.0 lb

## 2015-11-30 DIAGNOSIS — E1065 Type 1 diabetes mellitus with hyperglycemia: Secondary | ICD-10-CM

## 2015-11-30 DIAGNOSIS — Z09 Encounter for follow-up examination after completed treatment for conditions other than malignant neoplasm: Secondary | ICD-10-CM | POA: Diagnosis not present

## 2015-11-30 DIAGNOSIS — D693 Immune thrombocytopenic purpura: Secondary | ICD-10-CM | POA: Diagnosis not present

## 2015-11-30 DIAGNOSIS — Z6825 Body mass index (BMI) 25.0-25.9, adult: Secondary | ICD-10-CM | POA: Diagnosis not present

## 2015-11-30 DIAGNOSIS — C8288 Other types of follicular lymphoma, lymph nodes of multiple sites: Secondary | ICD-10-CM | POA: Diagnosis not present

## 2015-11-30 NOTE — Patient Instructions (Signed)
Cab ratios 10--14 --10

## 2015-11-30 NOTE — Progress Notes (Signed)
Patient ID: Corey Hudson, male   DOB: 03/15/1966, 50 y.o.   MRN: [id number]   Chief complaint: Type 1 diabetes followup:   CURRENT insulin pump brand:  Medtronic  670  Prior history: He has a long-standing type 1 diabetes which is usually well-controlled Previously A1c readings have been as low as 5.8 although was having more hypoglycemia in the past  HISTORY:   The pump SETTINGS are: Basal rate: 0.95 midnight-2:30 a.m.,2:30 a.m. = 0.70, 5 AM = 0.65 10 AM = 0.5, 7:30 PM = 0.75    Carb Ratio: 1: 12 at breakfast, lunch = 1:16,  dinner = 1:12.  Correction factor 1:40 during the day and 1:50 at night with target 100 -110 and active insulin 3 hours    His A1c generally is upper normal   Today he is coming in for his 1 week assessment on the 670 insulin pump after starting the auto  mode since 11/24/15     GLUCOSE CONTROL with the pump   Mean values apply above for all meters except median for One Touch  PRE-MEAL Fasting Lunch Dinner Bedtime Overall  Glucose range:   55-112  52-159     Mean/median: 132 118 131 116  139 +/-44    POST-MEAL PC Breakfast PC Lunch PC Dinner  Glucose range:      Mean/median: 110  137 112    Following patterns and  Details of management are present   Although his overall blood sugars are  Slightly higher they are still within the target range 78% of the time    He thinks that his postprandial readings are higher and his bolus amounts are inadequate.  She was previously on 1:10 coverage for breakfast and supper and this was reduced on his last visit prior to starting the 670 pump.   He thinks that his blood sugars go up fairly rapidly when he is eating and particularly when he is drinking something like Gatorade.   for this reason he is adding extra carbohydrates to his bolus currently    on an average however his blood sugars do not look much higher after meals   previously was not using continuous glucose monitoring but was still  getting 13% of his readings below target   He says that he has tendency to relatively low sugars when he is very active on some weekends particular towards the end of the day as seen on his sensor tracing on Saturday 8/5   His blood sugars are below target about 3% of the time and somewhat more likely to be lower in the late evening and once after midnight and also rarely around midday   his current insulin dose his 34 units per day with 62% in boluses  EXERCISE: He is usually active with various activities and variable level of activity at work  Hypoglycemia: He gets symptoms of confusion, feeling sleepy, anxious and at night will wake up with strange dreams and anxiety. Blood sugars may be in the 40s 50s when he has a low sugar, has been advised to use glucose tablets to treat symptoms   Lab Results  Component Value Date   HGBA1C 6.0 10/31/2015   HGBA1C 6.2 08/08/2015   HGBA1C 5.9 05/10/2015   Lab Results  Component Value Date   MICROALBUR <0.7 10/31/2015   LDLCALC 96 08/08/2015   CREATININE 1.09 10/31/2015       Medication List       Accurate as of 11/30/15  4:33 PM. Always use your most recent med list.          acetaminophen 325 MG tablet Commonly known as:  TYLENOL Take 650 mg by mouth every 6 (six) hours as needed. Reported on 11/09/2015   ALIGN 4 MG Caps Take 4 mg by mouth.   BAYER MICROLET LANCETS lancets Use as instructed to check your blood sugar 10 times per day   folic acid 1 MG tablet Commonly known as:  FOLVITE Take 1 mg by mouth daily.   glucose blood test strip Commonly known as:  BAYER CONTOUR NEXT TEST Use as instructed to check blood sugars 10 times per day dx code E10.9   LANTUS 100 UNIT/ML injection Generic drug:  insulin glargine USE AS DIRECTED UP TO 55 UNITS DAILY   multivitamin tablet Take 1 tablet by mouth daily.   NOVOLOG 100 UNIT/ML injection Generic drug:  insulin aspart USE MAX OF 85 UNITS DAILY IN PUMP       Allergies:  No Known Allergies  Past Medical History:  Diagnosis Date  . Cancer ([place])   . Diabetes mellitus     No past surgical history on file.  Family History  Problem Relation Age of Onset  . Diabetes Paternal Aunt   . Heart disease Maternal Grandfather     Social History:  reports that he has never smoked. He does not have any smokeless tobacco history on file. He reports that he does not drink alcohol. His drug history is not on file.  REVIEW of systems:   Prior history of hematological malignancy, has normal CBC including platelets which are low normal  followed by hematologist   HYPERLIPIDEMIA: Minimal, LDL is Below 100 more recently   Lab Results  Component Value Date   CHOL 164 08/08/2015   HDL 58.30 08/08/2015   LDLCALC 96 08/08/2015   LDLDIRECT 100.0 12/18/2013   TRIG 47.0 08/08/2015   CHOLHDL 3 08/08/2015   He has occasional tingling in his feet which started after his chemotherapy, Stable  Diabetic foot in 4/17 was normal   EXAM:  BP 112/62   Pulse (!) 52   Ht 6' (1.829 m)   Wt 192 lb (87.1 kg)   SpO2 97%   BMI 26.04 kg/m   ASSESSMENT:  Type 1 diabetes, usually overall well controlled with A1c 6 recently See history of present illness for detailed discussion of current diabetes management, blood sugar patterns and problems identified   although his blood sugars have been fluctuating somewhat even with starting the auto he has had less frequent hypoglycemia   As expected he is having some tendency to higher readings postprandial but not consistently   this is also difficult to assess as he has sometimes several carbohydrate boluses in the evenings  Also appears that he may have some decrease in his blood sugars when the basal rate goes up to control his postprandial hyperglycemia although he has occasionally bolused manually on his own also   Currently according to his last week's blood sugars his A1c should be projected at 6.5% which is adequate   Will  now increase his carbohydrate coverage back to 1:10 at breakfast and supper and 1:14 at  Lunch He will also try to reduce his coverage when he is planning to be more active  he will also try to get his blood sugar to be a temporary 150 target an hour before he is planning to be very active  Follow-up in 4 weeks  There are  no Patient Instructions on file for this visit.  Counseling time on subjects discussed above is over 50% of today's 25 minute visit  Iva Montelongo

## 2016-01-02 ENCOUNTER — Ambulatory Visit: Payer: BLUE CROSS/BLUE SHIELD | Admitting: Endocrinology

## 2016-01-02 DIAGNOSIS — E109 Type 1 diabetes mellitus without complications: Secondary | ICD-10-CM | POA: Diagnosis not present

## 2016-01-04 ENCOUNTER — Ambulatory Visit (INDEPENDENT_AMBULATORY_CARE_PROVIDER_SITE_OTHER): Payer: BLUE CROSS/BLUE SHIELD | Admitting: Endocrinology

## 2016-01-04 ENCOUNTER — Encounter: Payer: Self-pay | Admitting: Endocrinology

## 2016-01-04 VITALS — BP 125/76 | HR 51 | Ht 72.0 in | Wt 192.0 lb

## 2016-01-04 DIAGNOSIS — E1065 Type 1 diabetes mellitus with hyperglycemia: Secondary | ICD-10-CM | POA: Diagnosis not present

## 2016-01-04 NOTE — Progress Notes (Signed)
Patient ID: Corey Hudson, male   DOB: 1966/01/15, 50 y.o.   MRN: [id number]   Chief complaint: Type 1 diabetes followup:   CURRENT insulin pump brand:  Medtronic  [id number]  Prior history: He has a long-standing type 1 diabetes which is usually well-controlled Previously A1c readings have been as low as 5.8 although was having more hypoglycemia in the past  HISTORY:   The pump SETTINGS are: Basal rate: 0.95 midnight-2:30 a.m.,2:30 a.m. = 0.70, 5 AM = 0.65 10 AM = 0.5, 7:30 PM = 0.75    Carb Ratio: 1:10 at breakfast, lunch = 1: 14,  dinner = 1: 10.  Correction factor 1:40 during the day and 1:50 at night with target 100 -110 and active insulin 3 hours   His A1c generally is upper normal and consistent   Following patterns of blood sugar analysis and  Details of management are present   In the last week he has been in the auto mode 90% of the time, was in auto mode since last evening since he did not have sensors  HYPERGLYCEMIA is most significantly present in the early hours of the night at least 3 times in the last week but not as much previously.  Appears that his blood sugars go up after his basal rate shuts off when blood sugars are low normal late evening  HIGHEST blood sugars are on an average around 2-3 AM but overnight blood sugars are significantly variable  POSTPRANDIAL readings: Although he has occasional high readings after meals last week in the evening they are now tending to be low normal after his evening boluses.  His carbohydrate coverage was increased with starting the auto mode  Pre-and postmeal readings for BREAKFAST: 125 and 119; before LUNCH 118 and 159 and 4 DINNER 124 and 133 on an average from his sensor analysis  FASTING readings are ranging recently between 75-220  Blood sugars are within the TARGET range 76% of the time and mildly increased 15% of the time: Have been between 50-70 about 5% of the time  HYPOGLYCEMIA has occurred occasionally  after breakfast and after evening meal with lowest documented reading 50.  This was according to patient related to increase physical activity  He will try to avoid hypoglycemia when he is planning to be more active by entering less carbohydrates at meals.  With more intense physical activity he will use at target blood sugar of 150 about 30-40 minutes before going into activities and this may usually help occasionally will still have to drink some juice when sugars are getting low   his current insulin dose his 42 units per day with 60 % in boluses  COMPARISON of blood sugars compared to previous 2 weeks show less tendency to low normal or low readings overall and during the afternoons and evenings less variability in the high range  EXERCISE: He is usually active with various activities and variable level of activity at work   [place] with the pump   AVERAGE blood sugar 145+/-57  Hypoglycemia: He gets symptoms of confusion, feeling sleepy, anxious and at night will wake up with strange dreams and anxiety. Blood sugars may be in the 40s 50s when he has a low sugar, has been advised to use glucose tablets to treat symptoms   Lab Results  Component Value Date   HGBA1C 6.0 10/31/2015   HGBA1C 6.2 08/08/2015   HGBA1C 5.9 05/10/2015   Lab Results  Component Value Date   MICROALBUR <  0.7 10/31/2015   LDLCALC 96 08/08/2015   CREATININE 1.09 10/31/2015       Medication List       Accurate as of 01/04/16  1:03 PM. Always use your most recent med list.          acetaminophen 325 MG tablet Commonly known as:  TYLENOL Take 650 mg by mouth every 6 (six) hours as needed. Reported on 11/09/2015   ALIGN 4 MG Caps Take 4 mg by mouth.   BAYER MICROLET LANCETS lancets Use as instructed to check your blood sugar 10 times per day   folic acid 1 MG tablet Commonly known as:  FOLVITE Take 1 mg by mouth daily.   glucose blood test strip Commonly known as:  BAYER CONTOUR NEXT  TEST Use as instructed to check blood sugars 10 times per day dx code E10.9   LANTUS 100 UNIT/ML injection Generic drug:  insulin glargine USE AS DIRECTED UP TO 55 UNITS DAILY   multivitamin tablet Take 1 tablet by mouth daily.   NOVOLOG 100 UNIT/ML injection Generic drug:  insulin aspart USE MAX OF 85 UNITS DAILY IN PUMP       Allergies: No Known Allergies  Past Medical History:  Diagnosis Date  . Cancer ([place])   . Diabetes mellitus     No past surgical history on file.  Family History  Problem Relation Age of Onset  . Diabetes Paternal Aunt   . Heart disease Maternal Grandfather     Social History:  reports that he has never smoked. He does not have any smokeless tobacco history on file. He reports that he does not drink alcohol. His drug history is not on file.  REVIEW of systems:   Prior history of hematological malignancy, has normal CBC including platelets which are low normal  followed by hematologist   HYPERLIPIDEMIA: Minimal, LDL is Below 100 more recently   Lab Results  Component Value Date   CHOL 164 08/08/2015   HDL 58.30 08/08/2015   LDLCALC 96 08/08/2015   LDLDIRECT 100.0 12/18/2013   TRIG 47.0 08/08/2015   CHOLHDL 3 08/08/2015   He has occasional tingling in his feet which started after his chemotherapy, Stable  Diabetic foot in 4/17 was normal   EXAM:  BP 125/76   Pulse (!) 51   Ht 6' (1.829 m)   Wt 192 lb (87.1 kg)   BMI 26.04 kg/m   ASSESSMENT:  Type 1 diabetes, usually overall well controlled with A1c 6 recently See history of present illness for detailed discussion of current diabetes management, blood sugar patterns and problems identified  His blood sugars are averaging 145 more recently with somewhat less variability and probably less tendency to low normal readings Blood sugars are in the target range 76% of the time  Recommended that we reduce his carbohydrate coverage at suppertime and use of 1: 12 factor since blood  sugars may be low normal at bedtime and the basal rate cannot control the rise in blood sugar in the early hours of the night frequently Also discussed adjusting boluses when he is planning to be active since he may occasionally get low although this has been less frequent He does need to continue the auto mode most of the time since this will even out his blood sugars more consistently in the next few weeks No change needed in active insulin time  Follow-up in 8 weeks for A1c  Patient Instructions  Carb ratio 1:12     Counseling  time on subjects discussed above is over 50% of today's 25 minute visit  Kirsty Monjaraz

## 2016-01-04 NOTE — Patient Instructions (Signed)
Carb ratio 1:12

## 2016-01-19 ENCOUNTER — Other Ambulatory Visit: Payer: Self-pay | Admitting: Endocrinology

## 2016-02-16 ENCOUNTER — Other Ambulatory Visit: Payer: Self-pay | Admitting: Endocrinology

## 2016-02-27 ENCOUNTER — Other Ambulatory Visit (INDEPENDENT_AMBULATORY_CARE_PROVIDER_SITE_OTHER): Payer: BLUE CROSS/BLUE SHIELD

## 2016-02-27 DIAGNOSIS — E1065 Type 1 diabetes mellitus with hyperglycemia: Secondary | ICD-10-CM

## 2016-02-27 LAB — COMPREHENSIVE METABOLIC PANEL
ALBUMIN: 4.1 g/dL (ref 3.5–5.2)
ALT: 18 U/L (ref 0–53)
AST: 21 U/L (ref 0–37)
Alkaline Phosphatase: 57 U/L (ref 39–117)
BUN: 13 mg/dL (ref 6–23)
CALCIUM: 9.4 mg/dL (ref 8.4–10.5)
CHLORIDE: 103 meq/L (ref 96–112)
CO2: 31 mEq/L (ref 19–32)
Creatinine, Ser: 0.95 mg/dL (ref 0.40–1.50)
GFR: 88.81 mL/min (ref >60.00)
Glucose, Bld: 156 mg/dL — ABNORMAL HIGH (ref 70–99)
POTASSIUM: 4.7 meq/L (ref 3.5–5.1)
SODIUM: 139 meq/L (ref 135–145)
Total Bilirubin: 0.9 mg/dL (ref 0.2–1.2)
Total Protein: 6.3 g/dL (ref 6.0–8.3)

## 2016-02-27 LAB — HEMOGLOBIN A1C: Hgb A1c MFr Bld: 6.4 % (ref 4.6–6.5)

## 2016-02-29 ENCOUNTER — Ambulatory Visit: Payer: BLUE CROSS/BLUE SHIELD | Admitting: Endocrinology

## 2016-02-29 ENCOUNTER — Ambulatory Visit (INDEPENDENT_AMBULATORY_CARE_PROVIDER_SITE_OTHER): Payer: BLUE CROSS/BLUE SHIELD | Admitting: Endocrinology

## 2016-02-29 DIAGNOSIS — Z23 Encounter for immunization: Secondary | ICD-10-CM | POA: Diagnosis not present

## 2016-02-29 DIAGNOSIS — E1065 Type 1 diabetes mellitus with hyperglycemia: Secondary | ICD-10-CM | POA: Diagnosis not present

## 2016-02-29 NOTE — Progress Notes (Signed)
Patient ID: Corey Hudson, male   DOB: [birth year], 50 y.o.   MRN: [id number]   Chief complaint: Type 1 diabetes followup:   CURRENT insulin pump brand:  Medtronic  [id number]  Prior history: He has a long-standing type 1 diabetes which is usually well-controlled Previously A1c readings have been as low as 5.8 although was having more hypoglycemia in the past  HISTORY:   The pump SETTINGS are: Basal rate: 0.95 midnight-2:30 a.m.,2:30 a.m. = 0.70, 5 AM = 0.65 10 AM = 0.5, 7:30 PM = 0.75    Carb Ratio: 1:10 at breakfast, lunch = 1: 14,  dinner = 1: 10.  Correction factor 1:40 during the day and 1:50 at night with target 100 -110 and active insulin 3 hours   His A1c generally is upper normal and consistent   Following patterns of blood sugar analysis and  Details of management are present   In the last week he has been in the auto mode 88 % of the time  Patient said that he tried to do a manual mode on his own but found that this did not work as well  HYPERGLYCEMIA is again most significantly present in the early hours of the night at least 3 times in the last week but not as much previously.  Appears that his blood sugars go up after his basal rate shuts off when blood sugars are low normal late evening  HIGHEST blood sugars are around 2-3 AM but generally blood sugars are variable around that time; this may be partly related to occasional low normal blood sugars at bedtime and the basal rate not catching up fast enough  80% of his blood sugars were within the target range, previously 78%  POSTPRANDIAL readings: Although he has occasional high readings after meals in the last week either after lunch or dinner on an average blood sugars are not consistently high postprandial reading  Pre-and postmeal readings for BREAKFAST: 122 and 101; lunch 135 and 145 and dinner 137 and 150  FASTING readings between about 6 AM-8 AM are extremely consistent on an average about  120  HYPOGLYCEMIA has occurred occasionally late morning and midday and sporadically late afternoon and before supper time only; sometimes at work he sees his blood sugar getting lower but does not take action right away when he is busy  He has about 4% of his blood sugars between 50-70 and none below 50 which is slightly better than before  He has not been doing much exercise, previously had used a  target blood sugar of 150 about 30-40 minutes before going into activities    his current insulin dose his 44 units per day with 59% in boluses compared to 66% in the previous 2 weeks  COMPARISON of blood sugars compared to previous 2 weeks show less tendency to fluctuation during the daytime both in the high and low range and standard deviation is lower at 46  EXERCISE: He is usually active with various activities and variable level of activity at work   AVERAGE blood sugar 147+/-46, previously 148+/-61  Hypoglycemia: He gets symptoms of confusion, feeling sleepy, anxious and at night will wake up with strange dreams and anxiety. Blood sugars may be in the 40s 50s when he has a low sugar, has been advised to use glucose tablets to treat symptoms   Lab Results  Component Value Date   HGBA1C 6.4 02/27/2016   HGBA1C 6.0 10/31/2015   HGBA1C 6.2 08/08/2015  Lab Results  Component Value Date   MICROALBUR <0.7 10/31/2015   LDLCALC 96 08/08/2015   CREATININE 0.95 02/27/2016       Medication List       Accurate as of 02/29/16 11:59 PM. Always use your most recent med list.          acetaminophen 325 MG tablet Commonly known as:  TYLENOL Take 650 mg by mouth every 6 (six) hours as needed. Reported on 11/09/2015   ALIGN 4 MG Caps Take 4 mg by mouth.   BAYER CONTOUR NEXT TEST test strip Generic drug:  glucose blood USE TO CHECK BLOOD SUGAR 10 TIMES DAILY AS DIRECTED   BAYER CONTOUR NEXT TEST test strip Generic drug:  glucose blood USE TO CHECK BLOOD SUGAR 10 TIMES DAILY AS  DIRECTED   BAYER MICROLET LANCETS lancets Use as instructed to check your blood sugar 10 times per day   folic acid 1 MG tablet Commonly known as:  FOLVITE Take 1 mg by mouth daily.   LANTUS 100 UNIT/ML injection Generic drug:  insulin glargine USE AS DIRECTED UP TO 55 UNITS DAILY   multivitamin tablet Take 1 tablet by mouth daily.   NOVOLOG 100 UNIT/ML injection Generic drug:  insulin aspart USE MAX OF 85 UNITS DAILY IN PUMP       Allergies: No Known Allergies  Past Medical History:  Diagnosis Date  . Cancer ([place])   . Diabetes mellitus     No past surgical history on file.  Family History  Problem Relation Age of Onset  . Diabetes Paternal Aunt   . Heart disease Maternal Grandfather     Social History:  reports that he has never smoked. He does not have any smokeless tobacco history on file. He reports that he does not drink alcohol. His drug history is not on file.  REVIEW of systems:   Prior history of hematological malignancy, has Generally had normal CBC including platelets which are low normal  followed by hematologist   HYPERLIPIDEMIA: Not present recently, LDL is Below 100 Not on statins   Lab Results  Component Value Date   CHOL 164 08/08/2015   HDL 58.30 08/08/2015   LDLCALC 96 08/08/2015   LDLDIRECT 100.0 12/18/2013   TRIG 47.0 08/08/2015   CHOLHDL 3 08/08/2015   He has occasional tingling in his feet which started after his chemotherapy, Stable  Diabetic foot in 4/17 was normal   EXAM:  There were no vitals taken for this visit.  ASSESSMENT:  Type 1 diabetes, usually overall well controlled with A1c 6.4 recently See history of present illness for detailed discussion of current diabetes management, blood sugar patterns and problems identified  His blood sugars are showing less variability than before He does have some blood sugar spikes with occasional meals and tendency to low normal sugars periodically:  Blood sugars tend to be  the highest between midnight-4 AM and the maximum basal apparently does not work fast enough However he is very pleased with his auto mode Hypoglycemia is being avoided most of the time He has not had any hypoglycemia after breakfast recently when blood sugars may be relatively lower and some variability may be related to activity at work  Blood sugars are in the target range 80 % of the time  Recommended that he keep glucose tablets on him to prevent potential hypoglycemia while he is at work Currently do not recommend any change in carbohydrate ratio although may potentially need less coverage for breakfast.  Again discussed day-to-day management and he will call if he has tendency to low blood sugars Discussed timing of boluses pre-meal based on the blood sugar level  Influenza vaccine given   There are no Patient Instructions on file for this visit.  Counseling time on subjects discussed above is over 50% of today's 25 minute visit  Corey Hudson

## 2016-03-06 DIAGNOSIS — E109 Type 1 diabetes mellitus without complications: Secondary | ICD-10-CM | POA: Diagnosis not present

## 2016-03-06 DIAGNOSIS — H5203 Hypermetropia, bilateral: Secondary | ICD-10-CM | POA: Diagnosis not present

## 2016-03-06 DIAGNOSIS — Z794 Long term (current) use of insulin: Secondary | ICD-10-CM | POA: Diagnosis not present

## 2016-03-06 DIAGNOSIS — H11153 Pinguecula, bilateral: Secondary | ICD-10-CM | POA: Diagnosis not present

## 2016-03-12 DIAGNOSIS — E109 Type 1 diabetes mellitus without complications: Secondary | ICD-10-CM | POA: Diagnosis not present

## 2016-03-22 ENCOUNTER — Other Ambulatory Visit: Payer: Self-pay | Admitting: Endocrinology

## 2016-03-22 DIAGNOSIS — M546 Pain in thoracic spine: Secondary | ICD-10-CM | POA: Diagnosis not present

## 2016-03-22 DIAGNOSIS — Z6826 Body mass index (BMI) 26.0-26.9, adult: Secondary | ICD-10-CM | POA: Diagnosis not present

## 2016-03-22 DIAGNOSIS — E109 Type 1 diabetes mellitus without complications: Secondary | ICD-10-CM | POA: Diagnosis not present

## 2016-03-22 DIAGNOSIS — C8590 Non-Hodgkin lymphoma, unspecified, unspecified site: Secondary | ICD-10-CM | POA: Diagnosis not present

## 2016-04-02 DIAGNOSIS — E109 Type 1 diabetes mellitus without complications: Secondary | ICD-10-CM | POA: Diagnosis not present

## 2016-05-18 ENCOUNTER — Other Ambulatory Visit: Payer: Self-pay | Admitting: Endocrinology

## 2016-05-28 ENCOUNTER — Other Ambulatory Visit (INDEPENDENT_AMBULATORY_CARE_PROVIDER_SITE_OTHER): Payer: BLUE CROSS/BLUE SHIELD

## 2016-05-28 DIAGNOSIS — E1065 Type 1 diabetes mellitus with hyperglycemia: Secondary | ICD-10-CM

## 2016-05-28 LAB — COMPREHENSIVE METABOLIC PANEL
ALT: 20 U/L (ref 0–53)
AST: 20 U/L (ref 0–37)
Albumin: 4.1 g/dL (ref 3.5–5.2)
Alkaline Phosphatase: 60 U/L (ref 39–117)
BUN: 17 mg/dL (ref 6–23)
CALCIUM: 9.2 mg/dL (ref 8.4–10.5)
CHLORIDE: 104 meq/L (ref 96–112)
CO2: 29 meq/L (ref 19–32)
CREATININE: 0.94 mg/dL (ref 0.40–1.50)
GFR: 89.81 mL/min (ref >60.00)
Glucose, Bld: 172 mg/dL — ABNORMAL HIGH (ref 70–99)
Potassium: 4.4 mEq/L (ref 3.5–5.1)
SODIUM: 139 meq/L (ref 135–145)
Total Bilirubin: 0.6 mg/dL (ref 0.2–1.2)
Total Protein: 6.4 g/dL (ref 6.0–8.3)

## 2016-05-28 LAB — HEMOGLOBIN A1C: Hgb A1c MFr Bld: 6.4 % (ref 4.6–6.5)

## 2016-05-31 ENCOUNTER — Encounter: Payer: Self-pay | Admitting: Endocrinology

## 2016-05-31 ENCOUNTER — Ambulatory Visit (INDEPENDENT_AMBULATORY_CARE_PROVIDER_SITE_OTHER): Payer: BLUE CROSS/BLUE SHIELD | Admitting: Endocrinology

## 2016-05-31 VITALS — BP 104/78 | HR 57 | Temp 98.5°F | Resp 16 | Ht 72.0 in | Wt 195.0 lb

## 2016-05-31 DIAGNOSIS — E1065 Type 1 diabetes mellitus with hyperglycemia: Secondary | ICD-10-CM | POA: Diagnosis not present

## 2016-05-31 NOTE — Progress Notes (Signed)
Patient ID: Corey Hudson, male   DOB: [birth year], 51 y.o.   MRN: [id number]   Chief complaint: Type 1 diabetes followup:   CURRENT insulin pump brand:  Medtronic  [id number]  Prior history: He has a long-standing type 1 diabetes which is usually well-controlled Previously A1c readings have been as low as 5.8 although was having more hypoglycemia in the past  HISTORY:   The pump SETTINGS are: Basal rate: 0.95 midnight-2:30 a.m.,2:30 a.m. = 0.70, 5 AM = 0.65 10 AM = 0.5, 7:30 PM = 0.75    Carb Ratio: 1:10 at breakfast, lunch = 1: 14,  dinner = 1: 10.  Correction factor 1:40 during the day and 1:50 at night with target 100 -110 and active insulin 3 hours   His A1c generally is upper normal and consistent at 6.4   Following patterns of blood sugar analysis and  Details of management are present   In the last 2 weeks he has been in the AUTO mode 84% of the time and wearing the sensor 84 percent of the time  78% of his readings are within the target range along with 4% low readings and 15% high readings  However he could not get the charge her for his sensors December and had difficulty with his control at that time   HYPERGLYCEMIA is tending to happen somewhat late in the evenings after 9 PM and some after midnight and also some around 1-2 PM; he may be getting tendency to rebound from low or low normal readings prior to these hyperglycemic events as a basal rate does not catch up quickly enough   POSTPRANDIAL readings: Overall he is tending to have low normal readings after breakfast and sometimes as low readings after supper.  Readings that he may put in a stroke carbohydrates to get more insulin as he likes to get his control relatively tight  Pre-and postmeal readings for BREAKFAST: 134 and 116; LUNCH 155 and 161 and DINNER 149 and 134  FASTING readings between about 7 AM-8 AM are extremely consistent on an average about 120    HYPOGLYCEMIA has occurred most often after  evening meal between 5-8 p.m.; he thinks this is mostly because he starts getting more active in the evenings but does not cut back on his target, occasionally this can be related to overestimating carbohydrates for evening meal also.  Did have one episode of hypoglycemia after breakfast last Sunday from not eating a balanced meal and overestimating carbohydrates; he thinks that some of the low normal sugars after breakfast are from his trying to put an extra coverage for more aggressive control after breakfast.   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 he tends to get low sugars but he does not use the higher target on his pump   his current insulin dose his 44 units per day with 59% in boluses compared to 66% in the previous 2 weeks  COMPARISON of blood sugars compared to previous 2 weeks show fairly similar patterns, less fluctuation late morning and late evening and on average about the same readings  EXERCISE: He is not very active recently, just doing some chores in the evenings after supper  AVERAGE blood sugar 147+/-46, previously 148+/-61  Hypoglycemia: He gets symptoms of confusion, feeling sleepy, anxious and at night will wake up with strange dreams and anxiety. Blood sugars may be in the 40s 50s when he has a low sugar, has  been advised to use glucose tablets to treat symptoms   Lab Results  Component Value Date   HGBA1C 6.4 05/28/2016   HGBA1C 6.4 02/27/2016   HGBA1C 6.0 10/31/2015   Lab Results  Component Value Date   MICROALBUR <0.7 10/31/2015   LDLCALC 96 08/08/2015   CREATININE 0.94 05/28/2016     Allergies as of 05/31/2016   No Known Allergies     Medication List       Accurate as of 05/31/16 11:18 AM. Always use your most recent med list.          acetaminophen 325 MG tablet Commonly known as:  TYLENOL Take 650 mg by mouth every 6 (six) hours as needed. Reported on 11/09/2015   ALIGN 4 MG Caps Take 4  mg by mouth.   BAYER CONTOUR NEXT TEST test strip Generic drug:  glucose blood USE TO CHECK BLOOD SUGAR 10 TIMES DAILY AS DIRECTED   BAYER CONTOUR NEXT TEST test strip Generic drug:  glucose blood USE TO CHECK BLOOD SUGAR 10 TIMES DAILY AS DIRECTED   BAYER MICROLET LANCETS lancets Use as instructed to check your blood sugar 10 times per day   folic acid 1 MG tablet Commonly known as:  FOLVITE Take 1 mg by mouth daily.   LANTUS 100 UNIT/ML injection Generic drug:  insulin glargine USE AS DIRECTED UP TO 55 UNITS DAILY   multivitamin tablet Take 1 tablet by mouth daily.   NOVOLOG 100 UNIT/ML injection Generic drug:  insulin aspart USE MAX OF 85 UNITS DAILY IN PUMP       Allergies: No Known Allergies  Past Medical History:  Diagnosis Date  . Cancer ([place])   . Diabetes mellitus     History reviewed. No pertinent surgical history.  Family History  Problem Relation Age of Onset  . Diabetes Paternal Aunt   . Heart disease Maternal Grandfather     Social History:  reports that he has never smoked. He has never used smokeless tobacco. He reports that he does not drink alcohol. His drug history is not on file.  REVIEW of systems:  Asking about occasional sharp pains in the right side of his neck  HYPERLIPIDEMIA: Not present, LDL is Below 100 Not on statins   Lab Results  Component Value Date   CHOL 164 08/08/2015   HDL 58.30 08/08/2015   LDLCALC 96 08/08/2015   LDLDIRECT 100.0 12/18/2013   TRIG 47.0 08/08/2015   CHOLHDL 3 08/08/2015   He has Chronic occasional tingling in his feet which started after his chemotherapy  Diabetic foot in 4/17 was normal   EXAM:  BP 104/78   Pulse (!) 57   Temp 98.5 F (36.9 C) (Oral)   Resp 16   Ht 6' (1.829 m)   Wt 195 lb (88.5 kg)   SpO2 99%   BMI 26.45 kg/m   Thyroid not palpable Right submandibular SL every gland palpable but not tender  ASSESSMENT:  Type 1 diabetes, usually overall well controlled with  A1c 6.4 again See history of present illness for detailed discussion of current diabetes management, blood sugar patterns and problems identified  Blood sugars are in the target range 78 % of the time He has had tendency to hypoglycemia 4% of the time and 1% of the time relatively more severe Marked hyperglycemia occurs only 2% of the time He has found that he does much better with his control being in the auto mode which he has been able to do  84% of the time  He still has difficulty getting consistent postprandial control and some tendency to relatively low sugars after breakfast and supper, this is either related to excessive bolusing or in the evening sometimes related to more physical activity  For now he will try to be more accurate with his carbohydrate estimation and entry especially breakfast If still getting low normal sugars late morning he can change carbohydrate coverage 1: 12 in the morning Also he be consistently keeping his target at 150 before getting into physical activities that cause low sugars He will call if he has any significantly abnormal patterns and try to up load pump before his visits or if he has a problem   Patient Instructions  May need carb 1:12 in am   Counseling time on subjects discussed above is over 50% of today's 25 minute visit  Yarel Kilcrease

## 2016-05-31 NOTE — Patient Instructions (Signed)
May need carb 1:12 in am

## 2016-06-11 ENCOUNTER — Telehealth: Payer: Self-pay | Admitting: Endocrinology

## 2016-06-11 NOTE — Telephone Encounter (Signed)
Patient has a new pump and it disconnected, b/s is 479. Please advise

## 2016-06-14 NOTE — Telephone Encounter (Signed)
Corey Hudson spoke with the pt after this and he is settling out now with the pump  Corey Hudson is awesome per the pt

## 2016-06-14 NOTE — Telephone Encounter (Signed)
LM for pt to call back for follow up on this

## 2016-06-18 DIAGNOSIS — Z79899 Other long term (current) drug therapy: Secondary | ICD-10-CM | POA: Diagnosis not present

## 2016-06-18 DIAGNOSIS — Z6826 Body mass index (BMI) 26.0-26.9, adult: Secondary | ICD-10-CM | POA: Diagnosis not present

## 2016-06-18 DIAGNOSIS — D693 Immune thrombocytopenic purpura: Secondary | ICD-10-CM | POA: Diagnosis not present

## 2016-06-18 DIAGNOSIS — E119 Type 2 diabetes mellitus without complications: Secondary | ICD-10-CM | POA: Diagnosis not present

## 2016-06-18 DIAGNOSIS — Z9221 Personal history of antineoplastic chemotherapy: Secondary | ICD-10-CM | POA: Diagnosis not present

## 2016-06-18 DIAGNOSIS — C8288 Other types of follicular lymphoma, lymph nodes of multiple sites: Secondary | ICD-10-CM | POA: Diagnosis not present

## 2016-06-18 DIAGNOSIS — C8290 Follicular lymphoma, unspecified, unspecified site: Secondary | ICD-10-CM | POA: Diagnosis not present

## 2016-06-18 DIAGNOSIS — Z794 Long term (current) use of insulin: Secondary | ICD-10-CM | POA: Diagnosis not present

## 2016-06-26 DIAGNOSIS — E109 Type 1 diabetes mellitus without complications: Secondary | ICD-10-CM | POA: Diagnosis not present

## 2016-06-28 DIAGNOSIS — E109 Type 1 diabetes mellitus without complications: Secondary | ICD-10-CM | POA: Diagnosis not present

## 2016-07-19 ENCOUNTER — Other Ambulatory Visit: Payer: Self-pay

## 2016-07-19 ENCOUNTER — Telehealth: Payer: Self-pay | Admitting: Endocrinology

## 2016-07-19 MED ORDER — INSULIN ASPART 100 UNIT/ML ~~LOC~~ SOLN: SUBCUTANEOUS | 2 refills | Status: DC

## 2016-07-19 NOTE — Telephone Encounter (Signed)
Patient need refill of  NOVOLOG 100 UNIT/ML injection 20 mL   [facility] - [place] [place] - [place]. [phone] (Phone) [phone] (Fax)

## 2016-07-19 NOTE — Telephone Encounter (Signed)
Ordered

## 2016-07-23 ENCOUNTER — Other Ambulatory Visit: Payer: Self-pay

## 2016-07-23 MED ORDER — INSULIN ASPART 100 UNIT/ML ~~LOC~~ SOLN: SUBCUTANEOUS | 2 refills | Status: DC

## 2016-08-13 DIAGNOSIS — J029 Acute pharyngitis, unspecified: Secondary | ICD-10-CM | POA: Diagnosis not present

## 2016-08-23 ENCOUNTER — Other Ambulatory Visit (INDEPENDENT_AMBULATORY_CARE_PROVIDER_SITE_OTHER): Payer: BLUE CROSS/BLUE SHIELD

## 2016-08-23 DIAGNOSIS — E1065 Type 1 diabetes mellitus with hyperglycemia: Secondary | ICD-10-CM

## 2016-08-23 LAB — LIPID PANEL
Cholesterol: 159 mg/dL (ref 0–200)
HDL: 57.8 mg/dL (ref >39.00)
LDL Cholesterol: 89 mg/dL (ref 0–99)
NonHDL: 101.35
Total CHOL/HDL Ratio: 3
Triglycerides: 64 mg/dL (ref 0.0–149.0)
VLDL: 12.8 mg/dL (ref 0.0–40.0)

## 2016-08-23 LAB — MICROALBUMIN / CREATININE URINE RATIO
CREATININE, U: 74.6 mg/dL
Microalb Creat Ratio: 0.9 mg/g (ref 0.0–30.0)
Microalb, Ur: <0.7 mg/dL (ref 0.0–1.9)

## 2016-08-23 LAB — GLUCOSE, RANDOM: Glucose, Bld: 165 mg/dL — ABNORMAL HIGH (ref 70–99)

## 2016-08-23 LAB — HEMOGLOBIN A1C: Hgb A1c MFr Bld: 6.4 % (ref 4.6–6.5)

## 2016-08-28 ENCOUNTER — Ambulatory Visit: Payer: BLUE CROSS/BLUE SHIELD | Admitting: Endocrinology

## 2016-08-28 ENCOUNTER — Ambulatory Visit (INDEPENDENT_AMBULATORY_CARE_PROVIDER_SITE_OTHER): Payer: BLUE CROSS/BLUE SHIELD | Admitting: Endocrinology

## 2016-08-28 ENCOUNTER — Encounter: Payer: Self-pay | Admitting: Endocrinology

## 2016-08-28 VITALS — BP 130/84 | HR 54 | Ht 72.0 in | Wt 194.4 lb

## 2016-08-28 DIAGNOSIS — E1065 Type 1 diabetes mellitus with hyperglycemia: Secondary | ICD-10-CM

## 2016-08-28 NOTE — Patient Instructions (Signed)
No boluses if very active  Extra carbs for hi fat meals at supper
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
## 2016-09-11 DIAGNOSIS — E109 Type 1 diabetes mellitus without complications: Secondary | ICD-10-CM | POA: Diagnosis not present

## 2016-11-09 NOTE — Telephone Encounter (Signed)
error 

## 2016-11-26 DIAGNOSIS — Z125 Encounter for screening for malignant neoplasm of prostate: Secondary | ICD-10-CM | POA: Diagnosis not present

## 2016-11-26 DIAGNOSIS — E109 Type 1 diabetes mellitus without complications: Secondary | ICD-10-CM | POA: Diagnosis not present

## 2016-11-26 DIAGNOSIS — Z0000 Encounter for general adult medical examination without abnormal findings: Secondary | ICD-10-CM | POA: Diagnosis not present

## 2016-12-03 DIAGNOSIS — Z0000 Encounter for general adult medical examination without abnormal findings: Secondary | ICD-10-CM | POA: Diagnosis not present

## 2016-12-03 DIAGNOSIS — Z1389 Encounter for screening for other disorder: Secondary | ICD-10-CM | POA: Diagnosis not present

## 2016-12-03 DIAGNOSIS — M546 Pain in thoracic spine: Secondary | ICD-10-CM | POA: Diagnosis not present

## 2016-12-03 DIAGNOSIS — E109 Type 1 diabetes mellitus without complications: Secondary | ICD-10-CM | POA: Diagnosis not present

## 2016-12-03 DIAGNOSIS — C8590 Non-Hodgkin lymphoma, unspecified, unspecified site: Secondary | ICD-10-CM | POA: Diagnosis not present

## 2016-12-07 DIAGNOSIS — Z1212 Encounter for screening for malignant neoplasm of rectum: Secondary | ICD-10-CM | POA: Diagnosis not present

## 2016-12-10 DIAGNOSIS — E109 Type 1 diabetes mellitus without complications: Secondary | ICD-10-CM | POA: Diagnosis not present

## 2016-12-17 DIAGNOSIS — C8291 Follicular lymphoma, unspecified, lymph nodes of head, face, and neck: Secondary | ICD-10-CM | POA: Diagnosis not present

## 2016-12-17 DIAGNOSIS — C8288 Other types of follicular lymphoma, lymph nodes of multiple sites: Secondary | ICD-10-CM | POA: Diagnosis not present

## 2016-12-17 DIAGNOSIS — Z9225 Personal history of immunosupression therapy: Secondary | ICD-10-CM | POA: Diagnosis not present

## 2016-12-17 DIAGNOSIS — Z9641 Presence of insulin pump (external) (internal): Secondary | ICD-10-CM | POA: Diagnosis not present

## 2016-12-17 DIAGNOSIS — Z9221 Personal history of antineoplastic chemotherapy: Secondary | ICD-10-CM | POA: Diagnosis not present

## 2016-12-17 DIAGNOSIS — Z794 Long term (current) use of insulin: Secondary | ICD-10-CM | POA: Diagnosis not present

## 2016-12-17 DIAGNOSIS — E119 Type 2 diabetes mellitus without complications: Secondary | ICD-10-CM | POA: Diagnosis not present

## 2016-12-17 DIAGNOSIS — D693 Immune thrombocytopenic purpura: Secondary | ICD-10-CM | POA: Diagnosis not present

## 2016-12-31 ENCOUNTER — Other Ambulatory Visit (INDEPENDENT_AMBULATORY_CARE_PROVIDER_SITE_OTHER): Payer: BLUE CROSS/BLUE SHIELD

## 2016-12-31 ENCOUNTER — Other Ambulatory Visit: Payer: BLUE CROSS/BLUE SHIELD

## 2016-12-31 DIAGNOSIS — E1065 Type 1 diabetes mellitus with hyperglycemia: Secondary | ICD-10-CM | POA: Diagnosis not present

## 2016-12-31 LAB — HEMOGLOBIN A1C: Hgb A1c MFr Bld: 6.3 % (ref 4.6–6.5)

## 2016-12-31 LAB — BASIC METABOLIC PANEL
BUN: 16 mg/dL (ref 6–23)
CHLORIDE: 100 meq/L (ref 96–112)
CO2: 28 mEq/L (ref 19–32)
Calcium: 9.4 mg/dL (ref 8.4–10.5)
Creatinine, Ser: 1.05 mg/dL (ref 0.40–1.50)
GFR: 78.86 mL/min (ref >60.00)
GLUCOSE: 90 mg/dL (ref 70–99)
POTASSIUM: 3.8 meq/L (ref 3.5–5.1)
Sodium: 138 mEq/L (ref 135–145)

## 2017-01-02 NOTE — Progress Notes (Signed)
Patient ID: Corey Hudson, male   DOB: 1966-01-05, 51 y.o.   MRN: [id number]   Chief complaint: Type 1 diabetes followup:   CURRENT insulin pump brand:  Medtronic  [id number]  Prior history: He has a long-standing type 1 diabetes which is usually well-controlled Previously A1c readings have been as low as 5.8 although was having more hypoglycemia in the past  HISTORY:   The pump SETTINGS are: Basal rate: 0.95 midnight-2:30 a.m.,2:30 a.m. = 0.70, 5 AM = 0.65 10 AM = 0.5, 7:30 PM = 0.75    Carb Ratio: 1:10 at breakfast, lunch = 1: 14,  dinner = 1: 12.  Correction factor 1:40 during the day and 1:50 at night with target 100 -110 and active insulin 3 hours   Basal: Bolus ratio is 32/68%  His A1c generally is upper normal and consistent at 6.3   Following patterns of blood sugar analysis and  Details of management are present   In the last 2 weeks he has been in the AUTO mode 83 % of the time and wearing the sensor 84 percent of the time  86 % of his readings are within the target range which is improved along with 3 % low readings and 11 % high readings  AVERAGE blood sugar on the sensor is 131 with standard deviation 39   HYPERGLYCEMIA is occurring late at night starting after about 11 PM PM with blood sugar averaging 150-170 until about 3 AM; this may be sometimes related to rebound after low sugar of low-normal readings  Otherwise occasional high blood sugars are rarely related to a higher fat meal or eating out  He did have significantly high reading once during the day because of working in a very hot environment causing his insulin to be inactivated  Otherwise has no consistent hyperglycemia   POSTPRANDIAL readings: Overall he is tending to have fairly flat readings on an average after meals  Mean values apply above for all meters except median for One Touch  PRE-MEAL Fasting Lunch Dinner Bedtime Overall  Glucose range:       Mean/median: 128  112  131    131+/-39    POST-MEAL PC Breakfast PC Lunch PC Dinner  Glucose range:     Mean/median: 117  121  124      FASTING readings between about 7 AM-8 AM are fairly consistent with a range of 101-145    HYPOGLYCEMIA has occurred once at about 11 AM, twice at about 8 PM and 3 times around 7 PM  He appears to have hypoglycemia either related to her mealtime bolus in the late evening and only occasionally some increased activity  He has been able to avoid hypoglycemia with using the temporary target of 150 which he may start even 30 minutes before the planned activity  EXERCISE: He is periodically more active especially on weekends.    Hypoglycemia: He gets symptoms of confusion, feeling sleepy, anxious and at night will wake up with strange dreams and anxiety. Blood sugars may be in the 40s 50s when he has a low sugar, has been advised to use glucose tablets to treat symptoms   Lab Results  Component Value Date   HGBA1C 6.3 12/31/2016   HGBA1C 6.4 08/23/2016   HGBA1C 6.4 05/28/2016   Lab Results  Component Value Date   MICROALBUR <0.7 08/23/2016   LDLCALC 89 08/23/2016   CREATININE 1.05 12/31/2016     Allergies as of 01/03/2017   No Known  Allergies     Medication List       Accurate as of 01/03/17  9:34 AM. Always use your most recent med list.          acetaminophen 325 MG tablet Commonly known as:  TYLENOL Take 650 mg by mouth every 6 (six) hours as needed. Reported on 11/09/2015   BAYER CONTOUR NEXT TEST test strip Generic drug:  glucose blood USE TO CHECK BLOOD SUGAR 10 TIMES DAILY AS DIRECTED   BAYER MICROLET LANCETS lancets Use as instructed to check your blood sugar 10 times per day   folic acid 1 MG tablet Commonly known as:  FOLVITE Take 1 mg by mouth daily.   insulin aspart 100 UNIT/ML injection Commonly known as:  NOVOLOG USE MAX OF 85 UNITS DAILY IN PUMP   LANTUS 100 UNIT/ML injection Generic drug:  insulin glargine USE AS DIRECTED UP TO 55 UNITS  DAILY   multivitamin tablet Take 1 tablet by mouth daily.       Allergies: No Known Allergies  Past Medical History:  Diagnosis Date  . Cancer ([place])   . Diabetes mellitus     No past surgical history on file.  Family History  Problem Relation Age of Onset  . Diabetes Paternal Aunt   . Heart disease Maternal Grandfather     Social History:  reports that he has never smoked. He has never used smokeless tobacco. He reports that he does not drink alcohol. His drug history is not on file.  REVIEW of systems:   HYPERLIPIDEMIA: Not present, LDL is Below 100 Not on statin   Lab Results  Component Value Date   CHOL 159 08/23/2016   HDL 57.80 08/23/2016   LDLCALC 89 08/23/2016   LDLDIRECT 100.0 12/18/2013   TRIG 64.0 08/23/2016   CHOLHDL 3 08/23/2016   He has Chronic occasional tingling in his feet which started after his chemotherapy  Diabetic foot in 8/18 was normal   EXAM:  BP 112/80   Pulse (!) 58   Ht 6' (1.829 m)   Wt 193 lb 9.6 oz (87.8 kg)   SpO2 98%   BMI 26.26 kg/m    ASSESSMENT:  Type 1 diabetes, usually overall well controlled with A1c 6.3 and generally under 6.5  See history of present illness for detailed discussion of current diabetes management, blood sugar patterns and problems identified  His blood sugars are more consistently within the target range compared to his last visit As discussed above he still has some variability in his blood sugars at various times either related to variable coverage of his meals because of the type of meal he is eating or due to blood sugars rebounding after low normal readings Also only rarely he is getting low sugars related to exercise  Blood sugars are in the target range 86 % of the time and fairly consistent from last visit He is satisfied with his pump but has had some issues with sensory malfunction and occasionally with excessive heat his insulin goes back  Discussed day-to-day management of his  insulin pump, boluses, types of insulin available including Fiasp as well as adjustment based on types of his food intake He does not want to switch to [place] at this time although occasionally he does have a early peak after meals He will also try to reduce his carbohydrate coverage further at dinnertime to avoid tendency to low normal or low readings after eating Reminded him to add extra insulin for higher fat meals  or alcohol intake when eating out   Patient Instructions  Carb ratio 1:14 at dinner   Counseling time on subjects discussed above is over 50% of today's 25 minute visit  Anjalee Cope

## 2017-01-03 ENCOUNTER — Encounter: Payer: Self-pay | Admitting: Endocrinology

## 2017-01-03 ENCOUNTER — Ambulatory Visit (INDEPENDENT_AMBULATORY_CARE_PROVIDER_SITE_OTHER): Payer: BLUE CROSS/BLUE SHIELD | Admitting: Endocrinology

## 2017-01-03 VITALS — BP 112/80 | HR 58 | Ht 72.0 in | Wt 193.6 lb

## 2017-01-03 DIAGNOSIS — E1065 Type 1 diabetes mellitus with hyperglycemia: Secondary | ICD-10-CM

## 2017-01-03 NOTE — Patient Instructions (Signed)
Carb ratio 1:14 at dinner

## 2017-01-25 ENCOUNTER — Other Ambulatory Visit: Payer: Self-pay | Admitting: Endocrinology

## 2017-01-25 NOTE — Telephone Encounter (Signed)
MEDICATION: insulin aspart (NOVOLOG) 100 UNIT/ML injection  PHARMACY:   [facility] - [place], [address] [phone] (Phone) [phone] (Fax)   IS THIS A 90 DAY SUPPLY : yes  IS PATIENT OUT OF MEDICATION: yes  IF NOT; HOW MUCH IS LEFT:   LAST APPOINTMENT DATE: @9 /13/2018  NEXT APPOINTMENT DATE:@1 /14/2019  OTHER COMMENTS:    **Let patient know to contact pharmacy at the end of the day to make sure medication is ready. **  ** Please notify patient to allow 48-72 hours to process**  **Encourage patient to contact the pharmacy for refills or they can request refills through [facility]**

## 2017-01-28 DIAGNOSIS — J029 Acute pharyngitis, unspecified: Secondary | ICD-10-CM | POA: Diagnosis not present

## 2017-02-20 ENCOUNTER — Other Ambulatory Visit: Payer: Self-pay | Admitting: Endocrinology

## 2017-02-20 NOTE — Telephone Encounter (Signed)
Patient stated the pharmacy haven't received prescription for the bayer contour next test strips, he need them today.   sent to  [facility], [place]. [phone] (Phone) [phone] (Fax)

## 2017-04-02 DIAGNOSIS — Z794 Long term (current) use of insulin: Secondary | ICD-10-CM | POA: Diagnosis not present

## 2017-04-02 DIAGNOSIS — E109 Type 1 diabetes mellitus without complications: Secondary | ICD-10-CM | POA: Diagnosis not present

## 2017-04-12 ENCOUNTER — Other Ambulatory Visit: Payer: Self-pay | Admitting: Endocrinology

## 2017-04-25 DIAGNOSIS — H52221 Regular astigmatism, right eye: Secondary | ICD-10-CM | POA: Diagnosis not present

## 2017-04-25 DIAGNOSIS — H5203 Hypermetropia, bilateral: Secondary | ICD-10-CM | POA: Diagnosis not present

## 2017-04-25 DIAGNOSIS — H524 Presbyopia: Secondary | ICD-10-CM | POA: Diagnosis not present

## 2017-04-25 LAB — HM DIABETES EYE EXAM

## 2017-05-03 ENCOUNTER — Other Ambulatory Visit (INDEPENDENT_AMBULATORY_CARE_PROVIDER_SITE_OTHER): Payer: BLUE CROSS/BLUE SHIELD

## 2017-05-03 DIAGNOSIS — E1065 Type 1 diabetes mellitus with hyperglycemia: Secondary | ICD-10-CM | POA: Diagnosis not present

## 2017-05-03 LAB — HEMOGLOBIN A1C: HEMOGLOBIN A1C: 6.5 % (ref 4.6–6.5)

## 2017-05-03 LAB — GLUCOSE, RANDOM: GLUCOSE: 119 mg/dL — AB (ref 70–99)

## 2017-05-05 NOTE — Progress Notes (Signed)
Patient ID: Corey Hudson, male   DOB: 1965-12-07, 52 y.o.   MRN: [id number]   Chief complaint: Type 1 diabetes followup:   CURRENT insulin pump brand:  Medtronic  670  Prior history: He has a long-standing type 1 diabetes which is usually well-controlled Previously A1c readings have been as low as 5.8 although was having more hypoglycemia in the past  HISTORY:   The pump SETTINGS are: Basal rate: 0.95 midnight-2:30 a.m.,2:30 a.m. = 0.70, 5 AM = 0.65 10 AM = 0.5, 7:30 PM = 0.75   Carb Ratio: 1:10 at breakfast, lunch = 1: 14,  dinner = 1: 12.  Correction factor 1:40 during the day and 1:50 at night with target 100 -110 and active insulin 3 hours   Basal: Bolus ratio is 32/68%  His A1c generally is upper normal and consistent at 6.3   Following patterns of blood sugar analysis and  Details of management are present    HYPERGLYCEMIA is occurring again late at night either around 9-10 PM, just before midnight and occasionally around 2 AM  He usually has snacks late in the evening and sometimes this can be relatively high fat such as peanut butter crackers and he does not add extra for the higher fat content  He has done some correction boluses late at night when the blood sugars are over 200  Also has sporadic high readings after various meals including lunch, sometimes may be related to fast food but more likely sporadically in the evening after supper  His correction boluses appear to be affective at most times although on 04/25/17 his blood sugars stayed high throughout the day for unknown reason  POSTPRANDIAL readings: Overall he is tending to have fairly flat readings on an average after meals over 2-3 hours, on an average relatively lower after supper but not after other meals  CGM use % 85   Average and SD 145 +/-49   Time in range 76   % Time Above 180 19   % Time above 250 3   % Time Below target 2    In the last 2 weeks he has been in the AUTO mode 83  %  of the time   Mean values apply above for all meters except median for One Touch  PRE-MEAL Fasting Lunch Dinner Bedtime Overall  Glucose range:       Mean/median: 129  128  149  165     POST-MEAL PC Breakfast PC Lunch PC Dinner  Glucose range:     Mean/median: 133  140  163      FASTING readings between about 7 AM-8 AM are fairly consistent with a range of 94-190   HYPOGLYCEMIA has occurred rarely, once documented at bedtime and rarely around lunchtime   EXERCISE: He is only doing some exercise when the weather is good, recently only working but not vigorously  Hypoglycemia: He gets symptoms of confusion, feeling sleepy, anxious and at night will wake up with strange dreams and anxiety. Blood sugars may be in the 40s 50s when he has a low sugar, has been advised to use glucose tablets to treat symptoms   Wt Readings from Last 3 Encounters:  05/06/17 200 lb (90.7 kg)  01/03/17 193 lb 9.6 oz (87.8 kg)  08/28/16 194 lb 6.4 oz (88.2 kg)    Lab Results  Component Value Date   HGBA1C 6.5 05/03/2017   HGBA1C 6.3 12/31/2016   HGBA1C 6.4 08/23/2016   Lab Results  Component Value Date   MICROALBUR <0.7 08/23/2016   LDLCALC 89 08/23/2016   CREATININE 1.05 12/31/2016     Allergies as of 05/06/2017   No Known Allergies     Medication List        Accurate as of 05/06/17  9:01 AM. Always use your most recent med list.          acetaminophen 325 MG tablet Commonly known as:  TYLENOL Take 650 mg by mouth every 6 (six) hours as needed. Reported on 11/09/2015   BAYER MICROLET LANCETS lancets Use as instructed to check your blood sugar 10 times per day   CONTOUR NEXT TEST test strip Generic drug:  glucose blood USE TO CHECK BLOOD SUGAR 10 TIMES DAILY AS DIRECTED   folic acid 1 MG tablet Commonly known as:  FOLVITE Take 1 mg by mouth daily.   LANTUS 100 UNIT/ML injection Generic drug:  insulin glargine USE AS DIRECTED UP TO 55 UNITS DAILY   multivitamin  tablet Take 1 tablet by mouth daily.   NOVOLOG 100 UNIT/ML injection Generic drug:  insulin aspart INJECT A MAX OF 85 UNITS DAILY INTO PUMP   insulin aspart 100 UNIT/ML injection Commonly known as:  NOVOLOG USE MAX OF 85 UNITS DAILY IN PUMP       Allergies: No Known Allergies  Past Medical History:  Diagnosis Date  . Cancer ([place])   . Diabetes mellitus     No past surgical history on file.  Family History  Problem Relation Age of Onset  . Diabetes Paternal Aunt   . Heart disease Paternal Grandfather     Social History:  reports that  has never smoked. he has never used smokeless tobacco. He reports that he does not drink alcohol. His drug history is not on file.  REVIEW of systems:   HYPERLIPIDEMIA: Not present, LDL is Below 100 Not on statin Family history positive only with one relative for coronary disease   Lab Results  Component Value Date   CHOL 159 08/23/2016   HDL 57.80 08/23/2016   LDLCALC 89 08/23/2016   LDLDIRECT 100.0 12/18/2013   TRIG 64.0 08/23/2016   CHOLHDL 3 08/23/2016   He has Chronic occasional tingling in his feet which started after his chemotherapy  He is asking about a lesion in his palate Is overdue for dental exams  Diabetic foot in 8/18 was normal  Eye 1/19, reportedly normal   EXAM:  BP 116/66   Pulse 64   Ht 6' (1.829 m)   Wt 200 lb (90.7 kg)   SpO2 98%   BMI 27.12 kg/m   He has a 3-4 mm papular lesion on the left heart palate without redness or discoloration  ASSESSMENT:  Type 1 diabetes, usually overall well controlled with A1c  6.5  See history of present illness for detailed discussion of current diabetes management, blood sugar patterns and problems identified  His sugars are reasonably controlled again although less often and target compared to his last visit when he was 86% of the time within the target range and now 76% He does tend to have some hyperglycemia late in the evenings probably from inadequate  coverage of meals and snacks sometimes high-fat and content especially late at night and he is not able to adequately cover these all the time Discussed adding extra 1-2 units at least for higher fat meals and snacks including peanut butter  However does try to do correction boluses as needed Also he thinks that sometimes  with fast food his sugars maybe higher during the afternoon Otherwise on an average postprandial readings are controlled as above He probably does better when he is exercising which he has not done recently for various reasons   He will also try to get a new transmitter for his sensor from the company as discussed  Discussed day-to-day management of his insulin pump, mealtime boluses, and discussed periods of hyperglycemia    Also discuss possible need for using a statin drug and cardiovascular prevention simply because he has had long-standing diabetes, however his diabetes is usually been well controlled Will check lipids on the next visit  Recommended follow-up with dentist He will have his ophthalmologist send copy of his last exam   Patient Instructions  [email or web address]   to register    Extra 1-2 units for late nite snack   Counseling time on subjects discussed in assessment and plan sections is over 50% of today's 25 minute visit  Elayne Snare

## 2017-05-06 ENCOUNTER — Encounter: Payer: Self-pay | Admitting: Endocrinology

## 2017-05-06 ENCOUNTER — Ambulatory Visit (INDEPENDENT_AMBULATORY_CARE_PROVIDER_SITE_OTHER): Payer: BLUE CROSS/BLUE SHIELD | Admitting: Endocrinology

## 2017-05-06 ENCOUNTER — Telehealth: Payer: Self-pay | Admitting: Endocrinology

## 2017-05-06 ENCOUNTER — Other Ambulatory Visit: Payer: Self-pay

## 2017-05-06 VITALS — BP 116/66 | HR 64 | Ht 72.0 in | Wt 200.0 lb

## 2017-05-06 DIAGNOSIS — E1065 Type 1 diabetes mellitus with hyperglycemia: Secondary | ICD-10-CM

## 2017-05-06 MED ORDER — INSULIN ASPART 100 UNIT/ML ~~LOC~~ SOLN: SUBCUTANEOUS | 2 refills | Status: DC

## 2017-05-06 NOTE — Patient Instructions (Addendum)
[email or web address]   to register    Extra 1-2 units for late nite snack

## 2017-05-06 NOTE — Telephone Encounter (Signed)
You should be able to call on my behalf

## 2017-05-06 NOTE — Telephone Encounter (Signed)
Patient called eye Dr to request pictures from last week to be sent to Dr. Dwyane Dee but Eye Dr said the request must come from Dr Chriss Driver Dr.'s ph# [phone]

## 2017-05-06 NOTE — Telephone Encounter (Signed)
Called and eye doctor is going to fax [place] report

## 2017-05-16 ENCOUNTER — Encounter: Payer: Self-pay | Admitting: Endocrinology

## 2017-06-03 DIAGNOSIS — E109 Type 1 diabetes mellitus without complications: Secondary | ICD-10-CM | POA: Diagnosis not present

## 2017-06-03 DIAGNOSIS — Z1389 Encounter for screening for other disorder: Secondary | ICD-10-CM | POA: Diagnosis not present

## 2017-06-03 DIAGNOSIS — Z23 Encounter for immunization: Secondary | ICD-10-CM | POA: Diagnosis not present

## 2017-06-03 DIAGNOSIS — R82998 Other abnormal findings in urine: Secondary | ICD-10-CM | POA: Diagnosis not present

## 2017-06-03 DIAGNOSIS — R05 Cough: Secondary | ICD-10-CM | POA: Diagnosis not present

## 2017-06-14 DIAGNOSIS — J019 Acute sinusitis, unspecified: Secondary | ICD-10-CM | POA: Diagnosis not present

## 2017-06-14 DIAGNOSIS — R05 Cough: Secondary | ICD-10-CM | POA: Diagnosis not present

## 2017-06-14 DIAGNOSIS — R509 Fever, unspecified: Secondary | ICD-10-CM | POA: Diagnosis not present

## 2017-07-04 DIAGNOSIS — E109 Type 1 diabetes mellitus without complications: Secondary | ICD-10-CM | POA: Diagnosis not present

## 2017-08-05 DIAGNOSIS — H16143 Punctate keratitis, bilateral: Secondary | ICD-10-CM | POA: Diagnosis not present

## 2017-08-29 ENCOUNTER — Other Ambulatory Visit (INDEPENDENT_AMBULATORY_CARE_PROVIDER_SITE_OTHER): Payer: BLUE CROSS/BLUE SHIELD

## 2017-08-29 DIAGNOSIS — E1065 Type 1 diabetes mellitus with hyperglycemia: Secondary | ICD-10-CM

## 2017-08-29 LAB — COMPREHENSIVE METABOLIC PANEL
ALK PHOS: 55 U/L (ref 39–117)
ALT: 16 U/L (ref 0–53)
AST: 20 U/L (ref 0–37)
Albumin: 3.9 g/dL (ref 3.5–5.2)
BILIRUBIN TOTAL: 0.5 mg/dL (ref 0.2–1.2)
BUN: 16 mg/dL (ref 6–23)
CO2: 33 mEq/L — ABNORMAL HIGH (ref 19–32)
Calcium: 9.2 mg/dL (ref 8.4–10.5)
Chloride: 103 mEq/L (ref 96–112)
Creatinine, Ser: 0.94 mg/dL (ref 0.40–1.50)
GFR: 89.37 mL/min (ref >60.00)
Glucose, Bld: 106 mg/dL — ABNORMAL HIGH (ref 70–99)
Potassium: 4.6 mEq/L (ref 3.5–5.1)
SODIUM: 140 meq/L (ref 135–145)
TOTAL PROTEIN: 6.3 g/dL (ref 6.0–8.3)

## 2017-08-29 LAB — LIPID PANEL
CHOLESTEROL: 159 mg/dL (ref 0–200)
HDL: 56.5 mg/dL (ref >39.00)
LDL Cholesterol: 92 mg/dL (ref 0–99)
NONHDL: 102.06
Total CHOL/HDL Ratio: 3
Triglycerides: 48 mg/dL (ref 0.0–149.0)
VLDL: 9.6 mg/dL (ref 0.0–40.0)

## 2017-08-29 LAB — HEMOGLOBIN A1C: Hgb A1c MFr Bld: 6.4 % (ref 4.6–6.5)

## 2017-09-02 NOTE — Progress Notes (Addendum)
Patient ID: Corey Hudson, male   DOB: [birth year], 52 y.o.   MRN: [id number]   Chief complaint: Type 1 diabetes followup:   CURRENT insulin pump brand:  Medtronic  [id number]  Prior history: He has a long-standing type 1 diabetes which is usually well-controlled Previously A1c readings have been as low as 5.8 although was having more hypoglycemia in the past  HISTORY:   The pump SETTINGS are: Basal rate: 0.95 midnight-2:30 a.m.,2:30 a.m. = 0.70, 5 AM = 0.65 10 AM = 0.5, 7:30 PM = 0.75   Carb Ratio: 1:10 at breakfast, lunch and dinner = 1: 12.  Correction factor 1:40 during the day and 1:50 at night with target 100 -110 and active insulin 3 hours   Basal: Bolus ratio is 32/68% In the last 2 weeks he has been in the AUTO mode 71 % of the time   His A1c generally is upper normal and consistent at 6.4   Following patterns of blood sugar analysis and  Details of management are present    His overall average and variability is about the same as on the last visit  He says that he has been trying to frequently make corrections while on the auto mode and because of increased variability he tried to do the manual mode for some time  However he is mostly back on the auto mode and trying to allow the pump to make adjustments better than manual  HYPERGLYCEMIA is occurring irregularly in the evenings after about 7-8 PM and occasionally overnight but mostly when he is in the manual mode  May also have some variable blood sugars midafternoon, and this may be more related to when he is eating out and consistent control  However he is usually trying to make correction boluses when his blood sugars go up unexpectedly  HYPOGLYCEMIA has been sporadic and mostly related to either excessive boluses at meals are sometimes overcorrection in the manual mode  POSTPRANDIAL readings: Overall he is tending to have fairly flat readings on an average after meals as seen on his mealtime analysis but  more recently blood sugars are relatively lower after dinner some tendency to low normal readings also  He is usually bolusing right before eating unless his blood sugar is somewhat high for his meal  CGM use % of time 89  Average and SD 138  Time in range  78     %  % Time Above 180  18  % Time above 250  3  % Time Below target  4    Mean values apply above for all meters except median for One Touch  PRE-MEAL Fasting Lunch Dinner Bedtime Overall  Glucose range:       Mean/median:  133  136  131     POST-MEAL PC Breakfast PC Lunch PC Dinner  Glucose range:     Mean/median:  117  152  138     Hypoglycemia: He gets symptoms of confusion, feeling sleepy, anxious and at night will wake up with strange dreams and anxiety. Blood sugars may be in the 40s 50s when he has a low sugar, has been advised to use glucose tablets to treat symptoms   Wt Readings from Last 3 Encounters:  09/03/17 197 lb (89.4 kg)  05/06/17 200 lb (90.7 kg)  01/03/17 193 lb 9.6 oz (87.8 kg)    Lab Results  Component Value Date   HGBA1C 6.4 08/29/2017   HGBA1C 6.5 05/03/2017   HGBA1C  6.3 12/31/2016   Lab Results  Component Value Date   MICROALBUR <0.7 08/23/2016   LDLCALC 92 08/29/2017   CREATININE 0.94 08/29/2017     Allergies as of 09/03/2017   No Known Allergies     Medication List        Accurate as of 09/03/17  8:15 AM. Always use your most recent med list.          acetaminophen 325 MG tablet Commonly known as:  TYLENOL Take 650 mg by mouth every 6 (six) hours as needed. Reported on 11/09/2015   BAYER MICROLET LANCETS lancets Use as instructed to check your blood sugar 10 times per day   CONTOUR NEXT TEST test strip Generic drug:  glucose blood USE TO CHECK BLOOD SUGAR 10 TIMES DAILY AS DIRECTED   folic acid 1 MG tablet Commonly known as:  FOLVITE Take 1 mg by mouth daily.   LANTUS 100 UNIT/ML injection Generic drug:  insulin glargine USE AS DIRECTED UP TO 55 UNITS DAILY     multivitamin tablet Take 1 tablet by mouth daily.   NOVOLOG 100 UNIT/ML injection Generic drug:  insulin aspart INJECT A MAX OF 85 UNITS DAILY INTO PUMP   insulin aspart 100 UNIT/ML injection Commonly known as:  NOVOLOG USE MAX OF 85 UNITS DAILY IN PUMP       Allergies: No Known Allergies  Past Medical History:  Diagnosis Date  . Cancer ([place])   . Diabetes mellitus     History reviewed. No pertinent surgical history.  Family History  Problem Relation Age of Onset  . Diabetes Paternal Aunt   . Heart disease Paternal Grandfather     Social History:  reports that he has never smoked. He has never used smokeless tobacco. He reports that he does not drink alcohol. His drug history is not on file.  REVIEW of systems:   HYPERLIPIDEMIA: Not present, LDL is Below 100 Not on statin He has been reluctant to start a statin and not willing to take a long-term medication He is asking about side effects of statin drugs and whether he can stop it if needed Family history positive only with one relative for coronary disease   Lab Results  Component Value Date   CHOL 159 08/29/2017   HDL 56.50 08/29/2017   LDLCALC 92 08/29/2017   LDLDIRECT 100.0 12/18/2013   TRIG 48.0 08/29/2017   CHOLHDL 3 08/29/2017   He has Chronic occasional tingling in his feet which started after his chemotherapy   Diabetic foot in 8/18 was normal  Eye 1/19, reportedly normal   EXAM:  BP 118/80 (BP Location: Left Arm, Patient Position: Sitting, Cuff Size: Normal)   Pulse (!) 49   Ht 6' (1.829 m)   Wt 197 lb (89.4 kg)   SpO2 97%   BMI 26.72 kg/m     ASSESSMENT:  Type 1 diabetes, usually overall well controlled with A1c  6.4 now  See history of present illness for detailed discussion of current diabetes management, blood sugar patterns and problems identified  His sugars are showing some variability as before Although he is generally trying to keep blood sugars consistently controlled  he has the most variability after meals Recently appears to have relatively tight control after supper with the low normal readings No consistent pattern seen for hypoglycemia except sporadically at some meals and when he is in the manual mode He sometimes tries to manage his insulin boluses and correction more aggressively causing variability in basal learning  pump with the auto mode  Hypoglycemia has been minimal and couple of times when he was in the manual mode  Recommendations:  Carbohydrate ratio at suppertime to be 1: 14 since would like to avoid relatively lower blood sugars after evening boluses  May do correction for high readings after meals  Continue to bolus right before eating unless blood sugars are relatively high  If he sees that he is having relatively fast rise of blood sugar with meals he can consider Fiasp insulin but currently he is seeing adequate speed of action of his NovoLog boluses  Regular exercise and use temporary target when exercising  Continue to try and stay in the auto mode as much as possible    Discussed day-to-day management of his insulin pump, mealtime boluses, and management of hypoglycemia and prevention of hypoglycemia  Lipid management: Again discussed need for using a statin drug and cardiovascular prevention because he has had long-standing diabetes, however his diabetes is usually been well controlled Will check lipids on the next visit  Counseling time on subjects discussed in assessment and plan sections is over 50% of today's 25 minute visit     There are no Patient Instructions on file for this visit.    Elayne Snare

## 2017-09-03 ENCOUNTER — Ambulatory Visit (INDEPENDENT_AMBULATORY_CARE_PROVIDER_SITE_OTHER): Payer: BLUE CROSS/BLUE SHIELD | Admitting: Endocrinology

## 2017-09-03 ENCOUNTER — Encounter: Payer: Self-pay | Admitting: Endocrinology

## 2017-09-03 VITALS — BP 118/80 | HR 49 | Ht 72.0 in | Wt 197.0 lb

## 2017-09-03 DIAGNOSIS — E1065 Type 1 diabetes mellitus with hyperglycemia: Secondary | ICD-10-CM

## 2017-09-03 MED ORDER — PRAVASTATIN SODIUM 40 MG PO TABS: 40.0000 mg | ORAL_TABLET | Freq: Every day | ORAL | 3 refills | Status: DC

## 2017-09-03 NOTE — Patient Instructions (Signed)
Carb ratio 1:14

## 2017-09-13 ENCOUNTER — Other Ambulatory Visit: Payer: Self-pay | Admitting: Endocrinology

## 2017-10-04 DIAGNOSIS — E109 Type 1 diabetes mellitus without complications: Secondary | ICD-10-CM | POA: Diagnosis not present

## 2017-10-18 ENCOUNTER — Other Ambulatory Visit: Payer: Self-pay | Admitting: Endocrinology

## 2017-10-21 ENCOUNTER — Telehealth: Payer: Self-pay | Admitting: Endocrinology

## 2017-10-21 ENCOUNTER — Other Ambulatory Visit: Payer: Self-pay

## 2017-10-21 ENCOUNTER — Telehealth: Payer: Self-pay

## 2017-10-21 NOTE — Telephone Encounter (Signed)
Pt called earlier today and stated that the pharmacy told him that a PA was needed in order to get his Contour Next Test Strips. I then called pt's insurance company and they informed me that they could fax a form to fill out or do the PA via [email or web address]. I used [email or web address] and did the PA. (Key: [id number])  The PA was then cancelled at which time I called the help line for CoverMyMeds and inquired as to why. The help line told me that the PA was cancelled because the pt's insurance company is stating that the pt does not need a PA.  I then called the pt's pharmacy on file ([facility]) and they stated that every time they attempted to run the refill, they get a denial that states "already submitted to this third party." Once again, I then called the pt's insurance company to inquire about this, at which time they informed me that according to their records, there is a paid claim for the diabetic test strips for this pt at 3:38pm on 10/21/17. At this time, I was placed on a brief hold by the rep with the pt's insurance company while she called [facility] to perform a conference call in order to resolve the issue. The pharmacist then states that the strips have been filled and that the pharmacy technician that I spoke with earlier was attempted to submit another claim on top of the claim that had already been processed and approved by the insurance company. Pt then called and notified that his test strips are available for pick up at the pharmacy anytime he chooses. Pt verbalized understanding.

## 2017-10-21 NOTE — Telephone Encounter (Signed)
Patient stated that she need a refill of insulin, she use [facility] he called the pharmacy back and they had his insulin, but said the doctor need to approve his strips.  Patient did not state the name of her insulin.  Called the [facility]

## 2017-10-21 NOTE — Telephone Encounter (Signed)
Called and left voicemail for pt to call back.

## 2017-10-21 NOTE — Telephone Encounter (Signed)
Patient is calling stated she got a call from our office.

## 2017-10-21 NOTE — Telephone Encounter (Signed)
Pt needs PA for contour next test strips to use in conjunction with his insulin pump.

## 2017-11-12 DIAGNOSIS — E109 Type 1 diabetes mellitus without complications: Secondary | ICD-10-CM | POA: Diagnosis not present

## 2017-11-27 DIAGNOSIS — C8590 Non-Hodgkin lymphoma, unspecified, unspecified site: Secondary | ICD-10-CM | POA: Diagnosis not present

## 2017-11-27 DIAGNOSIS — R82998 Other abnormal findings in urine: Secondary | ICD-10-CM | POA: Diagnosis not present

## 2017-11-27 DIAGNOSIS — Z0000 Encounter for general adult medical examination without abnormal findings: Secondary | ICD-10-CM | POA: Diagnosis not present

## 2017-11-27 DIAGNOSIS — E109 Type 1 diabetes mellitus without complications: Secondary | ICD-10-CM | POA: Diagnosis not present

## 2017-11-27 DIAGNOSIS — Z125 Encounter for screening for malignant neoplasm of prostate: Secondary | ICD-10-CM | POA: Diagnosis not present

## 2017-11-29 DIAGNOSIS — E109 Type 1 diabetes mellitus without complications: Secondary | ICD-10-CM | POA: Diagnosis not present

## 2017-11-29 DIAGNOSIS — C8290 Follicular lymphoma, unspecified, unspecified site: Secondary | ICD-10-CM | POA: Diagnosis not present

## 2017-11-29 DIAGNOSIS — D693 Immune thrombocytopenic purpura: Secondary | ICD-10-CM | POA: Diagnosis not present

## 2017-12-04 DIAGNOSIS — E109 Type 1 diabetes mellitus without complications: Secondary | ICD-10-CM | POA: Diagnosis not present

## 2017-12-04 DIAGNOSIS — M546 Pain in thoracic spine: Secondary | ICD-10-CM | POA: Diagnosis not present

## 2017-12-04 DIAGNOSIS — C8590 Non-Hodgkin lymphoma, unspecified, unspecified site: Secondary | ICD-10-CM | POA: Diagnosis not present

## 2017-12-04 DIAGNOSIS — Z0000 Encounter for general adult medical examination without abnormal findings: Secondary | ICD-10-CM | POA: Diagnosis not present

## 2017-12-04 DIAGNOSIS — Z6826 Body mass index (BMI) 26.0-26.9, adult: Secondary | ICD-10-CM | POA: Diagnosis not present

## 2017-12-04 DIAGNOSIS — Z1212 Encounter for screening for malignant neoplasm of rectum: Secondary | ICD-10-CM | POA: Diagnosis not present

## 2018-01-03 ENCOUNTER — Other Ambulatory Visit (INDEPENDENT_AMBULATORY_CARE_PROVIDER_SITE_OTHER): Payer: BLUE CROSS/BLUE SHIELD

## 2018-01-03 DIAGNOSIS — E1065 Type 1 diabetes mellitus with hyperglycemia: Secondary | ICD-10-CM

## 2018-01-03 LAB — HEMOGLOBIN A1C: HEMOGLOBIN A1C: 6.5 % (ref 4.6–6.5)

## 2018-01-03 LAB — LIPID PANEL
CHOL/HDL RATIO: 2
Cholesterol: 144 mg/dL (ref 0–200)
HDL: 60 mg/dL (ref >39.00)
LDL CALC: 77 mg/dL (ref 0–99)
NonHDL: 83.64
TRIGLYCERIDES: 33 mg/dL (ref 0.0–149.0)
VLDL: 6.6 mg/dL (ref 0.0–40.0)

## 2018-01-03 LAB — COMPREHENSIVE METABOLIC PANEL
ALBUMIN: 4 g/dL (ref 3.5–5.2)
ALK PHOS: 45 U/L (ref 39–117)
ALT: 16 U/L (ref 0–53)
AST: 17 U/L (ref 0–37)
BILIRUBIN TOTAL: 0.8 mg/dL (ref 0.2–1.2)
BUN: 20 mg/dL (ref 6–23)
CALCIUM: 9.1 mg/dL (ref 8.4–10.5)
CO2: 28 mEq/L (ref 19–32)
Chloride: 104 mEq/L (ref 96–112)
Creatinine, Ser: 1 mg/dL (ref 0.40–1.50)
GFR: 83.1 mL/min (ref >60.00)
GLUCOSE: 127 mg/dL — AB (ref 70–99)
Potassium: 4.2 mEq/L (ref 3.5–5.1)
Sodium: 140 mEq/L (ref 135–145)
TOTAL PROTEIN: 6.3 g/dL (ref 6.0–8.3)

## 2018-01-03 LAB — MICROALBUMIN / CREATININE URINE RATIO
Creatinine,U: 106.5 mg/dL
MICROALB/CREAT RATIO: 0.7 mg/g (ref 0.0–30.0)
Microalb, Ur: <0.7 mg/dL (ref 0.0–1.9)

## 2018-01-05 NOTE — Progress Notes (Signed)
Patient ID: Corey Hudson, male   DOB: [birth year], 52 y.o.   MRN: [id number]   Chief complaint: Type 1 diabetes followup:   CURRENT insulin pump brand:  Medtronic  [id number]  Prior history: He has a long-standing type 1 diabetes which is usually well-controlled Previously A1c readings have been as low as 5.8 although was having more hypoglycemia in the past  HISTORY:   The pump SETTINGS are: Basal rate: 0.95 midnight-2:30 a.m.,2:30 a.m. = 0.70, 5 AM = 0.65 10 AM = 0.5, 7:30 PM = 0.75   Carb Ratio: 1:10 at breakfast, lunch 1: 12 and dinner = 1: 14 Correction factor 1:40 during the day and 1:50 at night with target 100 -110 and active insulin 3 hours    In the last 2 weeks he has been in the AUTO mode only 40 % of the time   His A1c is upper normal and consistent at 6.5   Following patterns of blood sugar analysis and  Details of management are present    He has had more problems with his pump malfunctioning, sensor issues needing replacement and has not been able to be in the auto mode for quite some time  When he is in the manual mode his blood sugars appear to be significantly high OVERNIGHT and he will frequently have readings over 200 requiring manual boluses  Also occasionally he thinks that his insulin may go back with exposure to heat or occasionally not reminding properly including yesterday when his blood sugar was higher  When he is in the auto mode his blood sugars are usually excellent with only rare tendency to hyperglycemia after meals  With reducing his carbohydrate coverage at suppertime he is not getting tendency to low normal or low sugars after supper  However now appears that with his boluses at lunchtime his blood sugars may be frequently low normal between 2 to 3 hours after eating and then may rebound  Blood sugars after breakfast are generally adequately controlled  He is usually bolusing right before eating unless his blood sugar is somewhat high  for his meal  CGM use % of time  40  Average and SD  142+/-44  Time in range       84 %  % Time Above 180  14  % Time above 250  2  % Time Below target 0   Average glucose on fingersticks 160   PRE-MEAL Fasting Lunch Dinner Bedtime Overall  Glucose range:  90-274      Mean/median:  135  143      POST-MEAL PC Breakfast PC Lunch PC Dinner  Glucose range:     Mean/median:  148  104  172     Hypoglycemia: He gets symptoms of confusion, feeling sleepy, anxious and at night will wake up with strange dreams and anxiety. Blood sugars may be in the 40s 50s when he has a low sugar, has been advised to use glucose tablets to treat symptoms   Wt Readings from Last 3 Encounters:  09/03/17 197 lb (89.4 kg)  05/06/17 200 lb (90.7 kg)  01/03/17 193 lb 9.6 oz (87.8 kg)    Lab Results  Component Value Date   HGBA1C 6.5 01/03/2018   HGBA1C 6.4 08/29/2017   HGBA1C 6.5 05/03/2017   Lab Results  Component Value Date   MICROALBUR <0.7 01/03/2018   LDLCALC 77 01/03/2018   CREATININE 1.00 01/03/2018     Allergies as of 01/06/2018   No Known  Allergies     Medication List        Accurate as of 01/06/18 12:55 PM. Always use your most recent med list.          acetaminophen 325 MG tablet Commonly known as:  TYLENOL Take 650 mg by mouth every 6 (six) hours as needed. Reported on 11/09/2015   BAYER MICROLET LANCETS lancets Use as instructed to check your blood sugar 10 times per day   CONTOUR NEXT TEST test strip Generic drug:  glucose blood USE TO CHECK BLOOD SUGAR 10 TIMES DAILY AS DIRECTED   folic acid 1 MG tablet Commonly known as:  FOLVITE Take 1 mg by mouth daily.   LANTUS 100 UNIT/ML injection Generic drug:  insulin glargine USE AS DIRECTED UP TO 55 UNITS DAILY   multivitamin tablet Take 1 tablet by mouth daily.   NOVOLOG 100 UNIT/ML injection Generic drug:  insulin aspart INJECT A MAX OF 85 UNITS DAILY INTO PUMP   insulin aspart 100 UNIT/ML injection Commonly  known as:  novoLOG USE MAX OF 85 UNITS DAILY IN PUMP   pravastatin 40 MG tablet Commonly known as:  PRAVACHOL Take 1 tablet (40 mg total) by mouth daily.       Allergies: No Known Allergies  Past Medical History:  Diagnosis Date  . Cancer ([place])   . Diabetes mellitus     No past surgical history on file.  Family History  Problem Relation Age of Onset  . Diabetes Paternal Aunt   . Heart disease Paternal Grandfather     Social History:  reports that he has never smoked. He has never used smokeless tobacco. He reports that he does not drink alcohol. His drug history is not on file.  REVIEW of systems:   HYPERLIPIDEMIA: Not present, LDL has been below 100 Has been started on pravastatin for cardiovascular protection LDL has improved and normal liver functions   Lab Results  Component Value Date   CHOL 144 01/03/2018   HDL 60.00 01/03/2018   LDLCALC 77 01/03/2018   LDLDIRECT 100.0 12/18/2013   TRIG 33.0 01/03/2018   CHOLHDL 2 01/03/2018   He has Chronic occasional tingling in his feet which started after his chemotherapy  He is asking about tenderness of his left pinna  Diabetic foot in 8/18 was normal  Eye 1/19, reportedly normal   EXAM:  There were no vitals taken for this visit.  Tenderness of the left pinnae in the medial area with minimal redness and swelling and no obvious recurrence of furuncle  ASSESSMENT:  Type 1 diabetes, on insulin pump, overall well controlled with A1c  6.5  See history of present illness for detailed discussion of current diabetes management, blood sugar patterns and problems identified  His blood sugars are excellent when he is in the auto mode with his pump Otherwise having more labile blood sugars because of issues with his pump, sensor malfunctioning and occasionally issues with his insulin in his tubing  He is also checking his blood sugars multiple times with fingersticks and usually doing well with carbohydrate  counting Hyperglycemia occurs only occasionally related to other factors than the settings on the pump on the auto mode However his blood sugars are usually very high overnight when he is in the manual mode with current basal rate settings  Another significant pattern is tendency to low normal or low sugars 2 to 3 hours after lunch with his current carbohydrate ratio 1: 12  Hypoglycemia has been minimal and he  usually is able to take precautions to prevent this including when he is active  Recommendations:  Carbohydrate ratio at lunchtime to be 1: 14  On the manual mode he will use a 1.25 basal rate at midnight and 0.9 at 2:30 AM  We will try to make sure he is changing his infusion set on time and also priming his infusion set properly with changes  Continue other settings  May consider increasing active insulin time if he is again having a tendency to relatively low sugars about 3 hours after meals    Discussed day-to-day management of insulin pump adjustments, mealtime blood sugars and boluses, management of hypoglycemia and hyperglycemia    Lipid levels: Excellent and he is tolerating pravastatin which is also being used for primary prevention He will continue the same dose  Early cellulitis of pinna: He will use OTC triple antibiotic for now  Counseling time on subjects discussed in assessment and plan sections is over 50% of today's 25 minute visit     There are no Patient Instructions on file for this visit.    Elayne Snare

## 2018-01-06 ENCOUNTER — Encounter: Payer: Self-pay | Admitting: Endocrinology

## 2018-01-06 ENCOUNTER — Ambulatory Visit (INDEPENDENT_AMBULATORY_CARE_PROVIDER_SITE_OTHER): Payer: BLUE CROSS/BLUE SHIELD | Admitting: Endocrinology

## 2018-01-06 VITALS — BP 110/66 | HR 78 | Ht 72.0 in | Wt 193.0 lb

## 2018-01-06 DIAGNOSIS — Z23 Encounter for immunization: Secondary | ICD-10-CM

## 2018-01-06 DIAGNOSIS — E1065 Type 1 diabetes mellitus with hyperglycemia: Secondary | ICD-10-CM

## 2018-01-06 DIAGNOSIS — E109 Type 1 diabetes mellitus without complications: Secondary | ICD-10-CM | POA: Diagnosis not present

## 2018-01-16 ENCOUNTER — Telehealth: Payer: Self-pay

## 2018-01-16 NOTE — Telephone Encounter (Signed)
Patient called today and stated he got flu shot here on 9/16 and his arm has been hurting ever since- he states he thinks the shot was given too high on his shoulder going into his joint. 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

## 2018-01-16 NOTE — Telephone Encounter (Signed)
He should have the area checked by his PCP if he is having local tenderness.  Otherwise may try an OTC pain patch

## 2018-01-17 NOTE — Telephone Encounter (Signed)
LMTCB

## 2018-01-21 NOTE — Telephone Encounter (Signed)
Have discussed with patient.  Since it has been several days since his injection unlikely related to the flu shot.  He also agrees it may be a pulled muscle or tendon from his work activity.  Offered a consultation with sports medicine but he wants to try the pain patch first.  Also recommended that he can go to an orthopedic urgent care if needed

## 2018-01-21 NOTE — Telephone Encounter (Signed)
Patient spoke with [facility] today and stated he has not received a call back about his previous note. Patient stated he is still in pain with pain level being 8-10 with movement and lifting and wants to get a call back as soon as possible because he believes the pain is due to the flu shot and wants to get some clarification

## 2018-01-21 NOTE — Telephone Encounter (Signed)
Pt stated that the pain is high in his shoulder joint. He can not lift his arm. He would like to know "who's wallet will take the hit" for this. Went over Dr. Jodelle Green message with him and he said he will call his PCP but he feels like this pain is going to cost him. Please advise if I need to inform the patient of anything else.

## 2018-01-22 ENCOUNTER — Encounter (HOSPITAL_COMMUNITY): Payer: Self-pay

## 2018-01-22 ENCOUNTER — Ambulatory Visit (HOSPITAL_COMMUNITY)
Admission: EM | Admit: 2018-01-22 | Discharge: 2018-01-22 | Disposition: A | Discharge status: Home / Self Care | Payer: BLUE CROSS/BLUE SHIELD | Attending: Family Medicine | Admitting: Family Medicine

## 2018-01-22 DIAGNOSIS — S46912A Strain of unspecified muscle, fascia and tendon at shoulder and upper arm level, left arm, initial encounter: Secondary | ICD-10-CM | POA: Diagnosis not present

## 2018-01-22 MED ORDER — MELOXICAM 15 MG PO TABS: 15.0000 mg | ORAL_TABLET | Freq: Every day | ORAL | 0 refills | Status: DC

## 2018-01-22 NOTE — ED Provider Notes (Signed)
[place]    CSN: [id number] Arrival date & time: 01/22/18  1641     History   Chief Complaint Chief Complaint  Patient presents with  . Shoulder Pain    HPI Corey Hudson is a 52 y.o. male.   Corey Hudson presents with complaints of left shoulder pain which started approximately 9/20. States he had gotten a flu shot and feels that the injection had been administered higher up on his shoulder than he would expect. It felt sore following this. A few days later while at work he was pulling hard on pipes. The following day he developed shoulder pain. Worse with certain movements. Is painful at night, slept in a recliner last night to help with pain. Pain with raising of his arm. Pain 7/10. No pain at rest. No numbness or tingling. States has injured this shoulder in the past, dislocated it when he was young. No previous surgeries. Does not follow with an orthopedist. Has taken advil occasionally which has minimally helped. Heat in the shower helps as well. He is right handed. Hx of DMT1.   ROS per HPI.      Past Medical History:  Diagnosis Date  . Cancer ([place])   . Diabetes mellitus     Patient Active Problem List   Diagnosis Date Noted  . Pure hypercholesterolemia 03/28/2013  . DIABETES MELLITUS, TYPE I 09/04/2010    History reviewed. No pertinent surgical history.     Home Medications    Prior to Admission medications   Medication Sig Start Date End Date Taking? Authorizing Provider  acetaminophen (TYLENOL) 325 MG tablet Take 650 mg by mouth every 6 (six) hours as needed. Reported on 11/09/2015    [provider]  BAYER MICROLET LANCETS lancets Use as instructed to check your blood sugar 10 times per day 12/11/13   Elayne Snare, MD  CONTOUR NEXT TEST test strip USE TO CHECK BLOOD SUGAR 10 TIMES DAILY AS DIRECTED 10/20/17   Elayne Snare, MD  folic acid (FOLVITE) 1 MG tablet Take 1 mg by mouth daily.    [provider]  insulin aspart (NOVOLOG)  100 UNIT/ML injection USE MAX OF 85 UNITS DAILY IN PUMP 09/17/17   Elayne Snare, MD  LANTUS 100 UNIT/ML injection USE AS DIRECTED UP TO 55 UNITS DAILY 08/04/15   Elayne Snare, MD  meloxicam (MOBIC) 15 MG tablet Take 1 tablet (15 mg total) by mouth daily. 01/22/18   Zigmund Gottron, NP  Multiple Vitamin (MULTIVITAMIN) tablet Take 1 tablet by mouth daily.    [provider]  NOVOLOG 100 UNIT/ML injection INJECT A MAX OF 85 UNITS DAILY INTO PUMP 04/12/17   Elayne Snare, MD  pravastatin (PRAVACHOL) 40 MG tablet Take 1 tablet (40 mg total) by mouth daily. 09/03/17   Elayne Snare, MD    Family History Family History  Problem Relation Age of Onset  . Diabetes Paternal Aunt   . Heart disease Paternal Grandfather     Social History Social History   Tobacco Use  . Smoking status: Never Smoker  . Smokeless tobacco: Never Used  Substance Use Topics  . Alcohol use: No  . Drug use: Not on file     Allergies   Patient has no known allergies.   Review of Systems Review of Systems   Physical Exam Triage Vital Signs ED Triage Vitals [01/22/18 1728]  Enc Vitals Group     BP 116/68     Pulse Rate 72  Resp 16     Temp 98.2 F (36.8 C)     Temp Source Oral     SpO2 100 %     Weight      Height      Head Circumference      Peak Flow      Pain Score      Pain Loc      Pain Edu?      Excl. in [place]?    No data found.  Updated Vital Signs BP 116/68 (BP Location: Left Arm)   Pulse 72   Temp 98.2 F (36.8 C) (Oral)   Resp 16   SpO2 100%   Visual Acuity Right Eye Distance:   Left Eye Distance:   Bilateral Distance:    Right Eye Near:   Left Eye Near:    Bilateral Near:     Physical Exam  Constitutional: He is oriented to person, place, and time. He appears well-developed and well-nourished.  Cardiovascular: Normal rate and regular rhythm.  Pulmonary/Chest: Effort normal and breath sounds normal.  Musculoskeletal:       Left shoulder: He exhibits decreased range of  motion, tenderness and pain. He exhibits no bony tenderness, no swelling, no effusion, no crepitus, no deformity, no laceration, no spasm, normal pulse and normal strength.       Arms: Superior shoulder with point tenderness, worse with over head arc, no bony tenderness; no redness, swelling or warmth; no pain with ac compression or adduction; strength equal bilaterally; gross sensation intact; strong radial pulse; cap refill < 2 seconds      ROS per HPI.   Neurological: He is alert and oriented to person, place, and time.  Skin: Skin is warm and dry.     UC Treatments / Results  Labs (all labs ordered are listed, but only abnormal results are displayed) Labs Reviewed - No data to display  EKG None  Radiology No results found.  Procedures Procedures (including critical care time)  Medications Ordered in UC Medications - No data to display  Initial Impression / Assessment and Plan / UC Course  I have reviewed the triage vital signs and the nursing notes.  Pertinent labs & imaging results that were available during my care of the patient were reviewed by me and considered in my medical decision making (see chart for details).     Imaging deferred, no bony or traumatic injury. No redness, swelling or warmth. Suspect strain related to work use. Rest as able, ice, nsaids for pain control. Encouraged follow up with sports med for persistent symptoms. Patient verbalized understanding and agreeable to plan.   Final Clinical Impressions(s) / UC Diagnoses   Final diagnoses:  Strain of left shoulder, initial encounter     Discharge Instructions     Light activity as tolerated, I wouldn't over do it with your shoulder to allow it to rest and heal.  Ice application at end of day.  Daily meloxicam. Take with food. Don't take additional ibuprofen.  Please follow up with Sports Medicine if persistent symptoms as may need further evaluation and treatment if it persists.     ED  Prescriptions    Medication Sig Dispense Auth. Provider   meloxicam (MOBIC) 15 MG tablet Take 1 tablet (15 mg total) by mouth daily. 30 tablet Zigmund Gottron, NP     Controlled Substance Prescriptions [place] Controlled Substance Registry consulted? Not Applicable   Zigmund Gottron, NP 01/22/18 [phone]

## 2018-01-22 NOTE — ED Triage Notes (Signed)
Pt presents with ongoing left shoulder pain

## 2018-01-22 NOTE — Discharge Instructions (Signed)
Light activity as tolerated, I wouldn't over do it with your shoulder to allow it to rest and heal.  Ice application at end of day.  Daily meloxicam. Take with food. Don't take additional ibuprofen.  Please follow up with Sports Medicine if persistent symptoms as may need further evaluation and treatment if it persists.

## 2018-02-25 DIAGNOSIS — Z1283 Encounter for screening for malignant neoplasm of skin: Secondary | ICD-10-CM | POA: Diagnosis not present

## 2018-02-25 DIAGNOSIS — D225 Melanocytic nevi of trunk: Secondary | ICD-10-CM | POA: Diagnosis not present

## 2018-02-25 DIAGNOSIS — L570 Actinic keratosis: Secondary | ICD-10-CM | POA: Diagnosis not present

## 2018-02-25 DIAGNOSIS — X32XXXD Exposure to sunlight, subsequent encounter: Secondary | ICD-10-CM | POA: Diagnosis not present

## 2018-02-25 DIAGNOSIS — L820 Inflamed seborrheic keratosis: Secondary | ICD-10-CM | POA: Diagnosis not present

## 2018-04-01 ENCOUNTER — Other Ambulatory Visit: Payer: Self-pay | Admitting: Endocrinology

## 2018-04-04 DIAGNOSIS — E109 Type 1 diabetes mellitus without complications: Secondary | ICD-10-CM | POA: Diagnosis not present

## 2018-04-29 ENCOUNTER — Other Ambulatory Visit: Payer: Self-pay | Admitting: Endocrinology

## 2018-04-30 DIAGNOSIS — E109 Type 1 diabetes mellitus without complications: Secondary | ICD-10-CM | POA: Diagnosis not present

## 2018-06-05 ENCOUNTER — Other Ambulatory Visit (INDEPENDENT_AMBULATORY_CARE_PROVIDER_SITE_OTHER): Payer: BLUE CROSS/BLUE SHIELD

## 2018-06-05 DIAGNOSIS — E1065 Type 1 diabetes mellitus with hyperglycemia: Secondary | ICD-10-CM

## 2018-06-05 LAB — HEMOGLOBIN A1C: Hgb A1c MFr Bld: 6.4 % (ref 4.6–6.5)

## 2018-06-05 LAB — GLUCOSE, RANDOM: Glucose, Bld: 130 mg/dL — ABNORMAL HIGH (ref 70–99)

## 2018-06-09 ENCOUNTER — Ambulatory Visit: Payer: BLUE CROSS/BLUE SHIELD | Admitting: Endocrinology

## 2018-06-09 DIAGNOSIS — M546 Pain in thoracic spine: Secondary | ICD-10-CM | POA: Diagnosis not present

## 2018-06-09 DIAGNOSIS — E663 Overweight: Secondary | ICD-10-CM | POA: Diagnosis not present

## 2018-06-09 DIAGNOSIS — Z8709 Personal history of other diseases of the respiratory system: Secondary | ICD-10-CM | POA: Diagnosis not present

## 2018-06-09 DIAGNOSIS — E109 Type 1 diabetes mellitus without complications: Secondary | ICD-10-CM | POA: Diagnosis not present

## 2018-06-10 ENCOUNTER — Ambulatory Visit (INDEPENDENT_AMBULATORY_CARE_PROVIDER_SITE_OTHER): Payer: BLUE CROSS/BLUE SHIELD | Admitting: Endocrinology

## 2018-06-10 ENCOUNTER — Encounter: Payer: Self-pay | Admitting: Endocrinology

## 2018-06-10 VITALS — BP 108/80 | HR 61 | Ht 72.0 in | Wt 209.0 lb

## 2018-06-10 DIAGNOSIS — E1065 Type 1 diabetes mellitus with hyperglycemia: Secondary | ICD-10-CM

## 2018-06-10 NOTE — Progress Notes (Addendum)
Patient ID: Corey Hudson, male   DOB: 09-12-1965, 53 y.o.   MRN: [id number]   Chief complaint: Type 1 diabetes followup:   CURRENT insulin pump brand:  Medtronic  670  Prior history: He has a long-standing type 1 diabetes which is usually well-controlled Previously A1c readings have been as low as 5.8 although was having more hypoglycemia in the past  HISTORY:   The pump SETTINGS are: Basal rate: 0.95 midnight-2:30 a.m.,2:30 a.m. = 0.70, 5 AM = 0.65 10 AM = 0.5, 7:30 PM = 0.75   Carb Ratio: 1:10 at breakfast, lunch 1: 12 and dinner = 1: 14 Correction factor 1:40 during the day and 1:50 at night with target 100 -110 and active insulin 3 hours    In the last 2 weeks he has been in the AUTO mode only 41 % of the time   His A1c is upper normal and consistent at 6.4, previously 6.5   Following patterns of blood sugar analysis and  Details of management are present    He has not used his sensor and auto mode consistently again  However recently had difficulty with some bleeding in the sensor on his abdomen and waiting to use a new one  His largest meal is at lunch but blood sugar response is somewhat variable after eating  He said that because of his relatively inactive lifestyle recently he has gained a significant amount of weight  He is concerned that the pump is not able to control his periods of hyperglycemia in the auto mode; however on review of his patterns it appears that his basal rate is relatively low at times because of falling blood sugar either from a mealtime or a correction bolus and the basal rate lags behind the hyperglycemia  He may occasionally overestimate his bolus amount also at meals which will slow down his basal  Currently his MAXIMUM basal rate is 2.45 but he may at times require slightly more basal rate to control hyperglycemia  Highest blood sugars are overall overnight and probably occurring for the same reasons as above even in the auto  mode  He is usually bolusing right before eating trying to be consistent with this  CGM use % of time  87  2-week average/SD  148+/-149  Time in range     73   %  % Time Above 180  24  % Time above 250 1  % Time Below 70  5     PRE-MEAL Fasting Lunch Dinner hs AC/PC Overall  Glucose range:       Averages:  119  116  144  168/190    POST-MEAL PC Breakfast PC Lunch PC Dinner  Glucose range:     Averages:  115  152  134   Average blood sugar with fingersticks 146  Hypoglycemia: He gets symptoms of confusion, feeling sleepy, anxious and at night will wake up with strange dreams and anxiety. Blood sugars may be in the 40s 50s when he has a low sugar, has been advised to use glucose tablets to treat symptoms   Wt Readings from Last 3 Encounters:  06/10/18 209 lb (94.8 kg)  01/06/18 193 lb (87.5 kg)  09/03/17 197 lb (89.4 kg)    Lab Results  Component Value Date   HGBA1C 6.4 06/05/2018   HGBA1C 6.5 01/03/2018   HGBA1C 6.4 08/29/2017   Lab Results  Component Value Date   MICROALBUR <0.7 01/03/2018   [place] 77 01/03/2018  CREATININE 1.00 01/03/2018     Allergies as of 06/10/2018   No Known Allergies     Medication List       Accurate as of June 10, 2018 10:29 AM. Always use your most recent med list.        acetaminophen 325 MG tablet Commonly known as:  TYLENOL Take 650 mg by mouth every 6 (six) hours as needed. Reported on 11/09/2015   BAYER MICROLET LANCETS lancets Use as instructed to check your blood sugar 10 times per day   bifidobacterium infantis capsule Take 1 capsule by mouth daily. TAKE 1 TABLET BY MOUTH ONCE DAILY.   CONTOUR NEXT TEST test strip Generic drug:  glucose blood USE TO CHECK BLOOD SUGAR 10 TIMES DAILY AS DIRECTED   folic acid 1 MG tablet Commonly known as:  FOLVITE Take 1 mg by mouth daily.   LANTUS 100 UNIT/ML injection Generic drug:  insulin glargine USE AS DIRECTED UP TO 55 UNITS DAILY   multivitamin tablet Take 1  tablet by mouth daily.   NOVOLOG 100 UNIT/ML injection Generic drug:  insulin aspart INJECT A MAX OF 85 UNITS DAILY INTO PUMP   pravastatin 40 MG tablet Commonly known as:  PRAVACHOL TAKE 1 TABLET BY MOUTH DAILY       Allergies: No Known Allergies  Past Medical History:  Diagnosis Date  . Cancer ([place])   . Diabetes mellitus     History reviewed. No pertinent surgical history.  Family History  Problem Relation Age of Onset  . Diabetes Paternal Aunt   . Heart disease Paternal Grandfather     Social History:  reports that he has never smoked. He has never used smokeless tobacco. He reports that he does not drink alcohol. No history on file for drug.  REVIEW of systems:   HYPERLIPIDEMIA: Baseline LDL has been below 100 Has been started on pravastatin for cardiovascular protection LDL has been excellent     Lab Results  Component Value Date   CHOL 144 01/03/2018   HDL 60.00 01/03/2018   LDLCALC 77 01/03/2018   LDLDIRECT 100.0 12/18/2013   TRIG 33.0 01/03/2018   CHOLHDL 2 01/03/2018   He has Chronic occasional tingling in his feet which started after his chemotherapy    Diabetic foot in 8/18 was normal  Eye 1/19, reportedly normal   EXAM:  BP 108/80 (BP Location: Left Arm, Patient Position: Sitting, Cuff Size: Normal)   Pulse 61   Ht 6' (1.829 m)   Wt 209 lb (94.8 kg)   SpO2 99%   BMI 28.35 kg/m     ASSESSMENT:  Type 1 diabetes, on insulin pump, overall well controlled with A1c 6.4, previously 6.5  See history of present illness for detailed discussion of current diabetes management, blood sugar patterns and problems identified  His blood sugars are overall well controlled with average blood sugars about 145 both on fingersticks and on the sensor assessment  He is very consistent with trying to control periods of hyperglycemia but is not able to do it adequately in the auto mode As discussed above his basal rate appears to be inadequate or  lagging behind when he is getting to have higher readings including overnight Although this may be a factor of the insulin pump capability may do better by not taking excessive boluses for meals and also not taking correction boluses unless blood sugars are going up fairly quickly  Currently has an active insulin time is adequate   Recommendations:  Carbohydrate  ratio not to be changed at this time  His maximum basal rate will be increased up to 4.5  Adjust boluses as discussed above  May try upper arm for sites for his sensor instead of abdomen   Discussed day-to-day management of insulin pump management, mealtime blood sugars and boluses, management of and prevention of hyperglycemia  Follow-up in 4 months again   Counseling time on subjects discussed in assessment and plan sections is over 50% of today's 25 minute visit     There are no Patient Instructions on file for this visit.    Elayne Snare

## 2018-07-01 DIAGNOSIS — H11153 Pinguecula, bilateral: Secondary | ICD-10-CM | POA: Diagnosis not present

## 2018-07-01 DIAGNOSIS — H5203 Hypermetropia, bilateral: Secondary | ICD-10-CM | POA: Diagnosis not present

## 2018-07-01 DIAGNOSIS — H524 Presbyopia: Secondary | ICD-10-CM | POA: Diagnosis not present

## 2018-07-01 DIAGNOSIS — H52221 Regular astigmatism, right eye: Secondary | ICD-10-CM | POA: Diagnosis not present

## 2018-07-07 ENCOUNTER — Other Ambulatory Visit: Payer: Self-pay | Admitting: Endocrinology

## 2018-07-08 ENCOUNTER — Other Ambulatory Visit: Payer: Self-pay

## 2018-07-08 ENCOUNTER — Telehealth: Payer: Self-pay | Admitting: Endocrinology

## 2018-07-08 MED ORDER — INSULIN GLARGINE 100 UNIT/ML ~~LOC~~ SOLN
SUBCUTANEOUS | 5 refills | Status: AC
Start: 2018-07-08 — End: ?

## 2018-07-08 MED ORDER — INSULIN LISPRO 100 UNIT/ML ~~LOC~~ SOLN: 85.0000 [IU] | Freq: Once | SUBCUTANEOUS | 3 refills | Status: DC

## 2018-07-08 NOTE — Telephone Encounter (Signed)
Patient has called stating that he has always gotten 3 bottles of Novolog and now his pharmacy is only give him two. Also, would like a refill on his LANTUS 100 UNIT/ML injection. Out of Lantus, pharmacy refused to refill.  Please Advise, thanks

## 2018-07-08 NOTE — Telephone Encounter (Signed)
Rx for Lantus sent

## 2018-07-09 ENCOUNTER — Other Ambulatory Visit: Payer: Self-pay

## 2018-07-09 ENCOUNTER — Telehealth: Payer: Self-pay | Admitting: Endocrinology

## 2018-07-09 DIAGNOSIS — H02814 Retained foreign body in left upper eyelid: Secondary | ICD-10-CM | POA: Diagnosis not present

## 2018-07-09 MED ORDER — INSULIN ASPART 100 UNIT/ML ~~LOC~~ SOLN: SUBCUTANEOUS | 3 refills | Status: DC

## 2018-07-09 NOTE — Telephone Encounter (Signed)
Called pt and he just wanted to know how much insulin was sent to his pharmacy.

## 2018-07-09 NOTE — Telephone Encounter (Signed)
Patient would like a call back to clarify what was sent into the pharmacy. He stated the lantus and Novolog was sent but wanted to check on the dosage and supply.

## 2018-07-10 ENCOUNTER — Telehealth: Payer: Self-pay

## 2018-07-10 NOTE — Telephone Encounter (Signed)
Called patient's insurance on 07/09/2018, to find out why insurance is denying coverage for Humalog and Novolog. Spoke with a rep named Kennieth Rad M and was given no call reference number. Kennieth Rad stated that insurance denied coverage on Novolog Flexpen, and NOT on Novolog Vials. A prescription for Novolog vials was sent to the patient pharmacy and this am, a fax was received stating that insurance has denied [employer], even though, according to CoverMyMeds.com, Novolog is the preferred drug.  PA was submitted as urgent.  Andre Umanzor (Key: [id number]) Rx #: [id number] NovoLOG 100UNIT/ML solution   Form [employer] control surveyor Form (CB)

## 2018-07-11 DIAGNOSIS — E109 Type 1 diabetes mellitus without complications: Secondary | ICD-10-CM | POA: Diagnosis not present

## 2018-08-02 ENCOUNTER — Other Ambulatory Visit: Payer: Self-pay | Admitting: Endocrinology

## 2018-08-04 ENCOUNTER — Other Ambulatory Visit: Payer: Self-pay | Admitting: Endocrinology

## 2018-08-04 ENCOUNTER — Telehealth: Payer: Self-pay | Admitting: Endocrinology

## 2018-08-04 NOTE — Telephone Encounter (Signed)
MEDICATION:   pravastatin (PRAVACHOL) 40 MG tablet   PHARMACY:  [facility]  IS THIS A 90 DAY SUPPLY :   IS PATIENT OUT OF MEDICATION: Yes  IF NOT; HOW MUCH IS LEFT:   LAST APPOINTMENT DATE: @4 /02/2019  NEXT APPOINTMENT DATE:@6 /01/2019  DO WE HAVE YOUR PERMISSION TO LEAVE A DETAILED MESSAGE:  OTHER COMMENTS:    **Let patient know to contact pharmacy at the end of the day to make sure medication is ready. **  ** Please notify patient to allow 48-72 hours to process**  **Encourage patient to contact the pharmacy for refills or they can request refills through [facility]**

## 2018-08-04 NOTE — Telephone Encounter (Signed)
Sent!

## 2018-08-07 ENCOUNTER — Other Ambulatory Visit: Payer: Self-pay | Admitting: Endocrinology

## 2018-08-07 ENCOUNTER — Other Ambulatory Visit: Payer: Self-pay

## 2018-09-04 DIAGNOSIS — E109 Type 1 diabetes mellitus without complications: Secondary | ICD-10-CM | POA: Diagnosis not present

## 2018-09-04 DIAGNOSIS — Z794 Long term (current) use of insulin: Secondary | ICD-10-CM | POA: Diagnosis not present

## 2018-10-01 ENCOUNTER — Other Ambulatory Visit: Payer: BLUE CROSS/BLUE SHIELD

## 2018-10-07 ENCOUNTER — Ambulatory Visit: Payer: BLUE CROSS/BLUE SHIELD | Admitting: Endocrinology

## 2018-10-09 DIAGNOSIS — E109 Type 1 diabetes mellitus without complications: Secondary | ICD-10-CM | POA: Diagnosis not present

## 2018-10-20 ENCOUNTER — Other Ambulatory Visit: Payer: Self-pay

## 2018-10-20 ENCOUNTER — Ambulatory Visit (INDEPENDENT_AMBULATORY_CARE_PROVIDER_SITE_OTHER): Payer: BC Managed Care – PPO | Admitting: Endocrinology

## 2018-10-20 ENCOUNTER — Encounter: Payer: Self-pay | Admitting: Endocrinology

## 2018-10-20 VITALS — BP 124/80 | HR 82 | Temp 97.7°F | Ht 72.0 in | Wt 204.8 lb

## 2018-10-20 DIAGNOSIS — E7800 Pure hypercholesterolemia, unspecified: Secondary | ICD-10-CM | POA: Diagnosis not present

## 2018-10-20 DIAGNOSIS — E1065 Type 1 diabetes mellitus with hyperglycemia: Secondary | ICD-10-CM | POA: Diagnosis not present

## 2018-10-20 LAB — COMPREHENSIVE METABOLIC PANEL
ALT: 17 U/L (ref 0–53)
AST: 21 U/L (ref 0–37)
Albumin: 4.3 g/dL (ref 3.5–5.2)
Alkaline Phosphatase: 53 U/L (ref 39–117)
BUN: 17 mg/dL (ref 6–23)
CO2: 32 mEq/L (ref 19–32)
Calcium: 9.4 mg/dL (ref 8.4–10.5)
Chloride: 102 mEq/L (ref 96–112)
Creatinine, Ser: 0.98 mg/dL (ref 0.40–1.50)
GFR: 79.79 mL/min (ref >60.00)
Glucose, Bld: 133 mg/dL — ABNORMAL HIGH (ref 70–99)
Potassium: 4.5 mEq/L (ref 3.5–5.1)
Sodium: 138 mEq/L (ref 135–145)
Total Bilirubin: 0.6 mg/dL (ref 0.2–1.2)
Total Protein: 6.6 g/dL (ref 6.0–8.3)

## 2018-10-20 LAB — URINALYSIS, ROUTINE W REFLEX MICROSCOPIC
Bilirubin Urine: NEGATIVE
Hgb urine dipstick: NEGATIVE
Ketones, ur: NEGATIVE
Leukocytes,Ua: NEGATIVE
Nitrite: NEGATIVE
RBC / HPF: NONE SEEN (ref 0–?)
Specific Gravity, Urine: 1.025 (ref 1.000–1.030)
Total Protein, Urine: NEGATIVE
Urine Glucose: NEGATIVE
Urobilinogen, UA: 0.2 (ref 0.0–1.0)
pH: 6 (ref 5.0–8.0)

## 2018-10-20 LAB — POCT GLYCOSYLATED HEMOGLOBIN (HGB A1C): Hemoglobin A1C: 6.1 % — AB (ref 4.0–5.6)

## 2018-10-20 LAB — LIPID PANEL
Cholesterol: 138 mg/dL (ref 0–200)
HDL: 58 mg/dL (ref >39.00)
LDL Cholesterol: 70 mg/dL (ref 0–99)
NonHDL: 79.76
Total CHOL/HDL Ratio: 2
Triglycerides: 47 mg/dL (ref 0.0–149.0)
VLDL: 9.4 mg/dL (ref 0.0–40.0)

## 2018-10-20 NOTE — Progress Notes (Signed)
Patient ID: Corey Hudson, male   DOB: 1965/09/22, 53 y.o.   MRN: [id number]   Chief complaint: Type 1 diabetes followup:   CURRENT insulin pump brand:  Medtronic  670  Prior history: He has a long-standing type 1 diabetes which is usually well-controlled Previously A1c readings have been as low as 5.8 although was having more hypoglycemia in the past  HISTORY:   The pump SETTINGS are: Basal rate: 1.25, midnight-2:30 a.m.,2:30 a.m. = 0.90, 5 AM = 0.65 10 AM = 0.5, 7:30 PM = 0.75   Carb Ratio: 1:10 at breakfast, lunch 1: 12 and dinner = 1: 14 Correction factor 1:40 during the day and 1:50 at night with target 100 -110 and active insulin 3 hours    In the last 2 weeks he has been in the AUTO mode 91 % of the time   His A1c is upper normal and now 6.1 compared to 6.4  Following patterns of blood sugar analysis and  Details of management are present    He has not had any issues with his sensor compared to before when he was suggested using his arm also  Blood sugar patterns and problems with management are discussed in the interpretation of CGM below  Since he is tending to be more active in the evenings after dinner he may be getting lower readings overall compared to wintertime  On a few instances appears that he has overcorrection of high blood sugars especially later in the day; also he thinks that even sometimes he will override his pump to correct the high readings  Last evening his blood sugar was low normal after his late evening bolus and his blood sugar was over 300 early morning probably because of his basal not being adequate  He is usually bolusing right before eating and not ahead of time  His exercise is usually bike riding on weekends and sometimes even with using temporary target he may have low normal blood sugars because of vigorous activity, sometimes does have Gatorade to compensate but not always avoiding low normal sugars   CONTINUOUS GLUCOSE  MONITORING RECORD INTERPRETATION    Dates of Recording: 6/16 through 6/29  Sensor description: Guardian  Results statistics:   CGM use % of time  87  Average and SD  139+/-42  Time in range      79 %  Was 73  % Time Above 180  16  % Time above 250 1  % Time Below target  4    Glycemic patterns summary: Sugars are within the range most of the time and may be occasionally higher late afternoon or late evening and sometimes overnight Lowest blood sugars appear to be between 7-8 PM likely after his evening bolus Variability is more often after his afternoon and evening meals  Hyperglycemic episodes are occurring sporadically and inconsistently in the afternoons or evenings, most significantly last night as a rebound from low normal sugars  Hypoglycemic episodes were minimal and transient and occurring between 1-3 PM, 9-10 PM and once at 10 AM  Overnight periods: Blood sugars are modestly high around 150 average and stable and coming down around 7-8 AM  Preprandial periods:   BREAKFAST blood sugar range 70-204 and relatively stable Lunchtime blood sugars are more variable but averaging 138 Dinnertime blood sugars are averaging 143  Postprandial periods: These are somewhat difficult to evaluate especially on weekends when he had multiple carbohydrate meals or snacks  After breakfast: Glucose is stable at  130 After lunch:   Blood sugars are somewhat variable but averaging nearly the same as pre-meal blood sugar May have occasional low normal blood sugars between 1-2 hours later  After dinner: Glucose is generally trending lower and below 70 several times   PRE-MEAL Fasting Lunch Dinner Bedtime Overall  Glucose range:       Mean/median:  130  138  143     POST-MEAL PC Breakfast PC Lunch PC Dinner  Glucose range:     Mean/median:  130  132  124      Hypoglycemia: He gets symptoms of confusion, feeling sleepy, anxious and at night will wake up with strange dreams and  anxiety. Blood sugars may be in the 40s 50s when he has a low sugar, has been advised to use glucose tablets to treat symptoms   Wt Readings from Last 3 Encounters:  10/20/18 204 lb 12.8 oz (92.9 kg)  06/10/18 209 lb (94.8 kg)  01/06/18 193 lb (87.5 kg)    Lab Results  Component Value Date   HGBA1C 6.1 (A) 10/20/2018   HGBA1C 6.4 06/05/2018   HGBA1C 6.5 01/03/2018   Lab Results  Component Value Date   MICROALBUR <0.7 01/03/2018   LDLCALC 77 01/03/2018   CREATININE 1.00 01/03/2018     Allergies as of 10/20/2018   No Known Allergies     Medication List       Accurate as of October 20, 2018 10:55 AM. If you have any questions, ask your nurse or doctor.        acetaminophen 325 MG tablet Commonly known as: TYLENOL Take 650 mg by mouth every 6 (six) hours as needed. Reported on 11/09/2015   Bayer Microlet Lancets lancets Use as instructed to check your blood sugar 10 times per day   bifidobacterium infantis capsule Take 1 capsule by mouth daily. TAKE 1 TABLET BY MOUTH ONCE DAILY.   Contour Next Test test strip Generic drug: glucose blood USE TO CHECK BLOOD SUGAR 10 TIMES DAILY AS DIRECTED   folic acid 1 MG tablet Commonly known as: FOLVITE Take 1 mg by mouth daily.   insulin aspart 100 UNIT/ML injection Commonly known as: NovoLOG Inject max of 85 units under the skin once daily via insulin pump.   insulin glargine 100 UNIT/ML injection Commonly known as: Lantus USE AS DIRECTED UP TO 55 UNITS DAILY   multivitamin tablet Take 1 tablet by mouth daily.   pravastatin 40 MG tablet Commonly known as: PRAVACHOL TAKE 1 TABLET BY MOUTH DAILY       Allergies: No Known Allergies  Past Medical History:  Diagnosis Date  . Cancer ([place])   . Diabetes mellitus     No past surgical history on file.  Family History  Problem Relation Age of Onset  . Diabetes Paternal Aunt   . Heart disease Paternal Grandfather     Social History:  reports that he has never  smoked. He has never used smokeless tobacco. He reports that he does not drink alcohol. No history on file for drug.  REVIEW of systems:   HYPERLIPIDEMIA: Baseline LDL has been below 100 Has been started on pravastatin for cardiovascular protection LDL has been excellent and will need follow-up   Lab Results  Component Value Date   CHOL 144 01/03/2018   HDL 60.00 01/03/2018   LDLCALC 77 01/03/2018   LDLDIRECT 100.0 12/18/2013   TRIG 33.0 01/03/2018   CHOLHDL 2 01/03/2018   He has occasional tingling in his feet  which started after his chemotherapy 5 years ago  He has symptoms of occasional trigger finger and is asking about relationship to pravastatin  Diabetic foot in 8/18 was normal  Eye 20/20, reportedly normal   EXAM:  BP 124/80 (BP Location: Left Arm, Patient Position: Sitting, Cuff Size: Normal)   Pulse 82   Temp 97.7 F (36.5 C) (Oral)   Ht 6' (1.829 m)   Wt 204 lb 12.8 oz (92.9 kg)   SpO2 96%   BMI 27.78 kg/m     ASSESSMENT:  Type 1 diabetes, on insulin pump, overall well controlled  See history of present illness for detailed discussion of current diabetes management, blood sugar patterns and problems identified  A1c is 6.1  As above he still has some inconsistent blood sugar patterns around mealtimes and also tendency to occasional overcorrection as well as rebound when blood sugars are low normal Since his blood sugars are within target at about 79% of the time he is overall doing well Difficulties with getting blood sugars consistently controlled but discussed   Recommendations:  Carbohydrate ratio 1: 16 at dinnertime  Correction factor I: 12:45 noon  He can try Fiasp but he wants to wait till wintertime, may consider using a sample  He will need to have a snack before going for exercise and also can try doing the temporary target up to 30 minutes before letting the exercise, also continue to have Gatorade throughout his activity  Try to  bolus a few minutes before eating especially before breakfast  Call if having difficulties with inconsistent control   Discussed day-to-day management of boluses,, mealtime insulin management, advised on exercise, general advice on COVID-19 and treatment of hypoglycemia, hyperglycemia   Follow-up in 4 months again   Counseling time on subjects discussed in assessment and plan sections is over 50% of today's 25 minute visit   There are no Patient Instructions on file for this visit.    Elayne Snare

## 2018-10-21 LAB — MICROALBUMIN / CREATININE URINE RATIO
Creatinine,U: 123.1 mg/dL
Microalb Creat Ratio: 0.6 mg/g (ref 0.0–30.0)
Microalb, Ur: <0.7 mg/dL (ref 0.0–1.9)

## 2018-10-21 NOTE — Progress Notes (Signed)
Please call to let patient know that the lab results are normal and no further action needed

## 2018-10-29 DIAGNOSIS — E663 Overweight: Secondary | ICD-10-CM | POA: Diagnosis not present

## 2018-10-29 DIAGNOSIS — C8590 Non-Hodgkin lymphoma, unspecified, unspecified site: Secondary | ICD-10-CM | POA: Diagnosis not present

## 2018-10-29 DIAGNOSIS — M546 Pain in thoracic spine: Secondary | ICD-10-CM | POA: Diagnosis not present

## 2018-10-29 DIAGNOSIS — E109 Type 1 diabetes mellitus without complications: Secondary | ICD-10-CM | POA: Diagnosis not present

## 2018-12-26 DIAGNOSIS — E109 Type 1 diabetes mellitus without complications: Secondary | ICD-10-CM | POA: Diagnosis not present

## 2019-01-09 DIAGNOSIS — E109 Type 1 diabetes mellitus without complications: Secondary | ICD-10-CM | POA: Diagnosis not present

## 2019-02-03 DIAGNOSIS — E109 Type 1 diabetes mellitus without complications: Secondary | ICD-10-CM | POA: Diagnosis not present

## 2019-02-16 ENCOUNTER — Other Ambulatory Visit: Payer: Self-pay

## 2019-02-16 ENCOUNTER — Other Ambulatory Visit (INDEPENDENT_AMBULATORY_CARE_PROVIDER_SITE_OTHER): Payer: BC Managed Care – PPO

## 2019-02-16 DIAGNOSIS — E1065 Type 1 diabetes mellitus with hyperglycemia: Secondary | ICD-10-CM

## 2019-02-16 LAB — GLUCOSE, RANDOM: Glucose, Bld: 122 mg/dL — ABNORMAL HIGH (ref 70–99)

## 2019-02-16 LAB — HEMOGLOBIN A1C: Hgb A1c MFr Bld: 6.5 % (ref 4.6–6.5)

## 2019-02-17 NOTE — Progress Notes (Signed)
Patient ID: Corey Hudson, male   DOB: January 30, 1966, 53 y.o.   MRN: [id number]   Chief complaint: Type 1 diabetes followup:   CURRENT insulin pump brand:  [employer]  [id number]  Prior history: He has a long-standing type 1 diabetes which is usually well-controlled Previously A1c readings have been as low as 5.8 although was having more hypoglycemia in the past  HISTORY:   The pump SETTINGS are: Basal rate: 1.25, midnight-2:30 a.m.,2:30 a.m. = 0.90, 5 AM = 0.65 10 AM = 0.5, 7:30 PM = 0.75   Carb Ratio: 1:10 at breakfast, lunch 1: 12 5-9 PM = 1: 14 and 9 PM = 1: 12  Correction factor 1:40 during the day and 1:50 at night with target 100 -110 and active insulin 3 hours      His A1c is upper normal and now 6.5  Following patterns of blood sugar analysis and  Details of management are present   In the last 2 weeks he has been in the AUTO mode 71 % of the time   He has apparently received a new pump about a month ago and he thinks that this is not working as well with the auto mode resulting in relatively high reading  However recently most of his hyperglycemia appears to be postprandial and inconsistent  He may not be bolusing before starting to eat which is likely causing his sugars to be higher at times  Also the type of meal may be affecting his blood sugars at times  His evening food intake appears to be with multiple small meals or snacks and not just 1 meal  Occasionally with his bolus estimation he may overdo it and blood sugar may be relatively low especially after lunch  He also tries to use the manual mode sometimes because he thinks it may sometimes work better, however his blood sugars appear to get low more often with this  He is not doing very much exercise lately after summer  Weight is about the same  His mealtimes appear to be 8-9 AM for breakfast, 12 PM-2 PM for lunch and usually 7 PM for dinner  CONTINUOUS GLUCOSE MONITORING RECORD INTERPRETATION     Dates of Recording:   Sensor description: Guardian  Results statistics:   CGM use % of time  76  Average and SD  141+/-48  Time in range       77%  % Time Above 180  15  % Time above 250  4  % Time Below target  4    Glycemic patterns summary: Blood sugar variability is present mostly in the evenings representing postprandial variability However blood sugars are very stable during the day and may tend to be low normal around noontime at times  Hyperglycemic episodes correlated to meals at various times including sometimes persisting late in the night  Hypoglycemic episodes occurred apparently when he was not in the auto mode and may be related to over bolusing, mostly midday  Overnight periods: Blood sugars are starting off average about 170 at midnight and then gradually decreasing, also has more variability in the early part of the night  Preprandial periods: Blood sugars before meals are averaging about 133 at breakfast and lunch and 169 at the predinner bolus  Postprandial periods:   After breakfast: Blood sugars are fairly consistently level before and after the meal  After lunch: Blood sugars are generally flat after the meal with mild increase at 2-3 PM only  After  dinner: Blood sugars are mostly rising especially after 8 PM even though he is having multiple boluses and carbohydrate intake in the evenings However premeal blood sugar may be already higher   PRE-MEAL Fasting Lunch Dinner hs, AC/PC Overall  Glucose range:       Mean/median:  133  134  169  182/137    POST-MEAL PC Breakfast PC Lunch PC Dinner  Glucose range:     Mean/median:  111  138  183        Hypoglycemia: He gets symptoms of confusion, feeling sleepy, anxious and at night will wake up with strange dreams and anxiety. Blood sugars may be in the 40s 50s when he has a low sugar, has been advised to use glucose tablets to treat symptoms   Wt Readings from Last 3 Encounters:  02/18/19 204 lb  6.4 oz (92.7 kg)  10/20/18 204 lb 12.8 oz (92.9 kg)  06/10/18 209 lb (94.8 kg)    Lab Results  Component Value Date   HGBA1C 6.5 02/16/2019   HGBA1C 6.1 (A) 10/20/2018   HGBA1C 6.4 06/05/2018   Lab Results  Component Value Date   MICROALBUR <0.7 10/20/2018   [place] 70 10/20/2018   CREATININE 0.98 10/20/2018     Allergies as of 02/18/2019   No Known Allergies     Medication List       Accurate as of February 18, 2019  8:34 PM. If you have any questions, ask your nurse or doctor.        STOP taking these medications   bifidobacterium infantis capsule Stopped by: Elayne Snare, MD     TAKE these medications   acetaminophen 325 MG tablet Commonly known as: TYLENOL Take 650 mg by mouth every 6 (six) hours as needed. Reported on 11/09/2015   Bayer Microlet Lancets lancets Use as instructed to check your blood sugar 10 times per day   Contour Next Test test strip Generic drug: glucose blood USE TO CHECK BLOOD SUGAR 10 TIMES DAILY AS DIRECTED   folic acid 1 MG tablet Commonly known as: FOLVITE Take 1 mg by mouth daily.   insulin aspart 100 UNIT/ML injection Commonly known as: NovoLOG Inject max of 85 units under the skin once daily via insulin pump.   insulin glargine 100 UNIT/ML injection Commonly known as: Lantus USE AS DIRECTED UP TO 55 UNITS DAILY   multivitamin tablet Take 1 tablet by mouth daily.   pravastatin 40 MG tablet Commonly known as: PRAVACHOL TAKE 1 TABLET BY MOUTH DAILY       Allergies: No Known Allergies  Past Medical History:  Diagnosis Date  . Cancer ([place])   . Diabetes mellitus     History reviewed. No pertinent surgical history.  Family History  Problem Relation Age of Onset  . Diabetes Paternal Aunt   . Heart disease Paternal Grandfather     Social History:  reports that he has never smoked. He has never used smokeless tobacco. He reports that he does not drink alcohol. No history on file for drug.  REVIEW of systems:    HYPERLIPIDEMIA: Baseline LDL has been below 100 Has been started on pravastatin for cardiovascular protection LDL has been excellent   Lab Results  Component Value Date   CHOL 138 10/20/2018   HDL 58.00 10/20/2018   LDLCALC 70 10/20/2018   LDLDIRECT 100.0 12/18/2013   TRIG 47.0 10/20/2018   CHOLHDL 2 10/20/2018   He has no significant numbness or tingling in his feet, previously  had symptoms that started after his chemotherapy several years ago CBC has been followed by PCP   Diabetic foot in 10/20 was normal  Eye 2/20, reportedly normal, goes to [facility]   Jasmine December:  BP 110/80 (BP Location: Left Arm, Patient Position: Sitting, Cuff Size: Normal)   Pulse 86   Ht 6' (1.829 m)   Wt 204 lb 6.4 oz (92.7 kg)   SpO2 97%   BMI 27.72 kg/m   Diabetic Foot Exam - Simple   Simple Foot Form Diabetic Foot exam was performed with the following findings: Yes   Visual Inspection No deformities, no ulcerations, no other skin breakdown bilaterally: Yes Sensation Testing Intact to touch and monofilament testing bilaterally: Yes Pulse Check Posterior Tibialis and Dorsalis pulse intact bilaterally: Yes Comments      ASSESSMENT:  Type 1 diabetes, on insulin pump, overall well controlled  See history of present illness for detailed discussion of current diabetes management, blood sugar patterns and problems identified  A1c is 6.5  Recently is within the target range 77% of the time  Although his A1c is not significantly higher he thinks that his new pump for the last month has not been able to control his sugars as well with basal adjustments  Most of his hypoglycemia however appears to be postprandial This may be related to relatively late bolusing before the meal, not entering enough carbohydrates at times as well is not accounting for higher fat meals He also usually does better when he is more consistently active and has not done much lately As before he continues to check his  blood sugar by fingerstick also multiple times a day  Hypoglycemia has been likely from over bolusing and only occasionally related to increased activity and not missing temporary target enough  His CGM download was reviewed and patterns discussed Problems identified were elaborated and discussed how to improve overall level of control  Recommendations:  Bolus consistently up to 15 to 20-minute before each meal  He can try Fiasp with a sample today  Try to use a sensor and calibrate as needed as well as look into the 770 pump if available for upgrade  Try to avoid getting into the manual mode  May need to consider increasing carbohydrate for his meals between 5-9 PM also  May need to consistently pay attention to insulin on board when doing multiple correction boluses  Temporary target for increased periods of activity   Follow-up in 4 months again  Influenza vaccine given, patient handout on vaccine given  Counseling time on subjects discussed in assessment and plan sections is over 50% of today's 25 minute visit   There are no Patient Instructions on file for this visit.    Elayne Snare

## 2019-02-18 ENCOUNTER — Other Ambulatory Visit: Payer: Self-pay

## 2019-02-18 ENCOUNTER — Ambulatory Visit (INDEPENDENT_AMBULATORY_CARE_PROVIDER_SITE_OTHER): Payer: BC Managed Care – PPO | Admitting: Endocrinology

## 2019-02-18 ENCOUNTER — Encounter: Payer: Self-pay | Admitting: Endocrinology

## 2019-02-18 VITALS — BP 110/80 | HR 86 | Ht 72.0 in | Wt 204.4 lb

## 2019-02-18 DIAGNOSIS — E7800 Pure hypercholesterolemia, unspecified: Secondary | ICD-10-CM

## 2019-02-18 DIAGNOSIS — Z23 Encounter for immunization: Secondary | ICD-10-CM

## 2019-02-18 DIAGNOSIS — E1065 Type 1 diabetes mellitus with hyperglycemia: Secondary | ICD-10-CM

## 2019-03-12 DIAGNOSIS — E109 Type 1 diabetes mellitus without complications: Secondary | ICD-10-CM | POA: Diagnosis not present

## 2019-03-31 ENCOUNTER — Other Ambulatory Visit: Payer: Self-pay | Admitting: Endocrinology

## 2019-04-22 DIAGNOSIS — C8590 Non-Hodgkin lymphoma, unspecified, unspecified site: Secondary | ICD-10-CM | POA: Diagnosis not present

## 2019-04-22 DIAGNOSIS — R7889 Finding of other specified substances, not normally found in blood: Secondary | ICD-10-CM | POA: Diagnosis not present

## 2019-04-22 DIAGNOSIS — Z125 Encounter for screening for malignant neoplasm of prostate: Secondary | ICD-10-CM | POA: Diagnosis not present

## 2019-04-22 DIAGNOSIS — E109 Type 1 diabetes mellitus without complications: Secondary | ICD-10-CM | POA: Diagnosis not present

## 2019-04-22 DIAGNOSIS — Z79899 Other long term (current) drug therapy: Secondary | ICD-10-CM | POA: Diagnosis not present

## 2019-04-29 DIAGNOSIS — C8590 Non-Hodgkin lymphoma, unspecified, unspecified site: Secondary | ICD-10-CM | POA: Diagnosis not present

## 2019-04-29 DIAGNOSIS — Z0000 Encounter for general adult medical examination without abnormal findings: Secondary | ICD-10-CM | POA: Diagnosis not present

## 2019-04-29 DIAGNOSIS — Z1331 Encounter for screening for depression: Secondary | ICD-10-CM | POA: Diagnosis not present

## 2019-04-29 DIAGNOSIS — E109 Type 1 diabetes mellitus without complications: Secondary | ICD-10-CM | POA: Diagnosis not present

## 2019-05-04 ENCOUNTER — Other Ambulatory Visit: Payer: Self-pay | Admitting: Endocrinology

## 2019-05-07 DIAGNOSIS — Z794 Long term (current) use of insulin: Secondary | ICD-10-CM | POA: Diagnosis not present

## 2019-05-07 DIAGNOSIS — E109 Type 1 diabetes mellitus without complications: Secondary | ICD-10-CM | POA: Diagnosis not present

## 2019-06-10 ENCOUNTER — Other Ambulatory Visit: Payer: Self-pay | Admitting: Endocrinology

## 2019-06-22 ENCOUNTER — Other Ambulatory Visit: Payer: BC Managed Care – PPO

## 2019-06-24 ENCOUNTER — Ambulatory Visit: Payer: BC Managed Care – PPO | Admitting: Endocrinology

## 2019-07-10 DIAGNOSIS — H524 Presbyopia: Secondary | ICD-10-CM | POA: Diagnosis not present

## 2019-07-10 LAB — HM DIABETES EYE EXAM

## 2019-07-20 DIAGNOSIS — E109 Type 1 diabetes mellitus without complications: Secondary | ICD-10-CM | POA: Diagnosis not present

## 2019-07-20 DIAGNOSIS — Z794 Long term (current) use of insulin: Secondary | ICD-10-CM | POA: Diagnosis not present

## 2019-07-27 ENCOUNTER — Other Ambulatory Visit: Payer: Self-pay

## 2019-07-27 ENCOUNTER — Other Ambulatory Visit (INDEPENDENT_AMBULATORY_CARE_PROVIDER_SITE_OTHER): Payer: Self-pay

## 2019-07-27 DIAGNOSIS — E1065 Type 1 diabetes mellitus with hyperglycemia: Secondary | ICD-10-CM

## 2019-07-27 DIAGNOSIS — E7800 Pure hypercholesterolemia, unspecified: Secondary | ICD-10-CM

## 2019-07-27 LAB — LIPID PANEL
Cholesterol: 135 mg/dL (ref 0–200)
HDL: 55.7 mg/dL (ref >39.00)
LDL Cholesterol: 70 mg/dL (ref 0–99)
NonHDL: 79.22
Total CHOL/HDL Ratio: 2
Triglycerides: 47 mg/dL (ref 0.0–149.0)
VLDL: 9.4 mg/dL (ref 0.0–40.0)

## 2019-07-27 LAB — COMPREHENSIVE METABOLIC PANEL
ALT: 18 U/L (ref 0–53)
AST: 23 U/L (ref 0–37)
Albumin: 4.1 g/dL (ref 3.5–5.2)
Alkaline Phosphatase: 57 U/L (ref 39–117)
BUN: 17 mg/dL (ref 6–23)
CO2: 31 mEq/L (ref 19–32)
Calcium: 9.3 mg/dL (ref 8.4–10.5)
Chloride: 102 mEq/L (ref 96–112)
Creatinine, Ser: 1.01 mg/dL (ref 0.40–1.50)
GFR: 76.84 mL/min (ref >60.00)
Glucose, Bld: 94 mg/dL (ref 70–99)
Potassium: 4.2 mEq/L (ref 3.5–5.1)
Sodium: 139 mEq/L (ref 135–145)
Total Bilirubin: 0.8 mg/dL (ref 0.2–1.2)
Total Protein: 6.2 g/dL (ref 6.0–8.3)

## 2019-07-27 LAB — HEMOGLOBIN A1C: Hgb A1c MFr Bld: 6.6 % — ABNORMAL HIGH (ref 4.6–6.5)

## 2019-07-28 NOTE — Progress Notes (Signed)
Patient ID: Corey Hudson, male   DOB: 12-Jan-1966, 54 y.o.   MRN: [id number]   Chief complaint: Type 1 diabetes followup:   CURRENT insulin pump brand:  [id number]  Prior history: He has a long-standing type 1 diabetes which is usually well-controlled Previously A1c readings have been as low as 5.8 although was having more hypoglycemia in the past  HISTORY:   The pump SETTINGS are: Basal rate: 1.25, midnight-2:30 a.m.,2:30 a.m. = 0.90, 5 AM = 0.65 10 AM = 0.5, 7:30 PM = 0.75   Carb Ratio: 1:10 at breakfast, lunch 1: 12, 5-9 PM = 1: 14 and 9 PM = 1: 12  Correction factor 1:40 during the day and 1:50 at night with target 100 -110 and active insulin 3 hours     His A1c is again normal and now 6.6  Recommendations made on the last visit:   Bolus consistently up to 15 to 20-minute before each meal  He can try Fiasp with a sample today  Try to use a sensor and calibrate as needed as well as look into the 770 pump if available for upgrade  Try to avoid getting into the manual mode  May need to consider increasing carbohydrate for his meals between 5-9 PM also  May need to consistently pay attention to insulin on board when doing multiple correction boluses  Temporary target for increased periods of activity   In the last 2 weeks he has been in the AUTO mode 72 % of the time   He has been given a trial of Fiasp on his last visit but he did not think it made him significant difference  He usually tries to bolus right before eating and will sometimes have relatively higher readings postprandially  However periodically in the evenings especially if eating later he will have a low sugar or low normal sugar after his bolus and then he will rebound to high readings; his carbohydrate ratio is 1:12 after 9 PM and sometimes he will have a meal after 9 PM  With this the HIGHEST blood sugars are late at night  Since he does not think his pump auto mode is catching up  quickly for rising blood sugars he will use a manual temporary basal with some success  Once with using temporary basal overnight for longer period of time he had a low sugar  Recently has not done much formal exercise  His mealtimes appear to be 8-9 AM for breakfast, 12 PM-2 PM for lunch and usually 7 PM for dinner   CONTINUOUS GLUCOSE MONITORING RECORD INTERPRETATION    Dates of Recording:  Last 2 weeks Sensor description: Guardian  Results statistics:   CGM use % of time  79  Average and SD  151+/-54  Time in range  71      %  % Time Above 180  21  % Time above 250 5  % Time Below target  3    PRE-MEAL Fasting Lunch Dinner Bedtime Overall  Glucose range:       Mean/median:  124  138  139   151   POST-MEAL PC Breakfast PC Lunch PC Dinner  Glucose range:     Mean/median:  125  148  158    Glycemic patterns summary:   Hyperglycemic episodes are occurring mostly late at night with rebounding from low normal or low sugars but also sporadically in the early afternoons  Hypoglycemic episodes occurred mostly before lunchtime, relatively  mild and once when in the manual mode early morning as well as after dinner yesterday evening  Overnight periods: Blood sugars appear to be rising late at night after about 10 PM and not improving until about 4-5 AM  Preprandial periods: Blood sugars are generally fairly good before meals as above  Postprandial periods: Overall blood sugars at 3 hours are fairly flat compared to 1-2 hours after the meal   After breakfast: Blood sugars are usually fairly stable and about the same before and after the meals After lunch: Blood sugars tend to be higher about 1 hour postprandially but on an average fairly good by 2 hours occasionally with low normal readings After dinner: Blood sugars after meals are quite variable and periodically may be low about 1 hour later    CGM use % of time  76  Average and SD  141+/-48  Time in range       77%  %  Time Above 180  15  % Time above 250  4  % Time Below target  4    Glycemic patterns summary: Blood sugar variability is present mostly in the evenings representing postprandial variability However blood sugars are very stable during the day and may tend to be low normal around noontime at times  Hyperglycemic episodes correlated to meals at various times including sometimes persisting late in the night  Hypoglycemic episodes occurred apparently when he was not in the auto mode and may be related to over bolusing, mostly midday  Overnight periods: Blood sugars are starting off average about 170 at midnight and then gradually decreasing, also has more variability in the early part of the night  Preprandial periods: Blood sugars before meals are averaging about 133 at breakfast and lunch and 169 at the predinner bolus  Postprandial periods:   After breakfast: Blood sugars are fairly consistently level before and after the meal  After lunch: Blood sugars are generally flat after the meal with mild increase at 2-3 PM only  After dinner: Blood sugars are mostly rising especially after 8 PM even though he is having multiple boluses and carbohydrate intake in the evenings However premeal blood sugar may be already higher   PRE-MEAL Fasting Lunch Dinner hs, AC/PC Overall  Glucose range:       Mean/median:  133  134  169  182/137    POST-MEAL PC Breakfast PC Lunch PC Dinner  Glucose range:     Mean/median:  111  138  183       Hypoglycemia: He gets symptoms of confusion, feeling sleepy, anxious and at night will wake up with strange dreams and anxiety. Blood sugars may be in the 40s 50s when he has a low sugar, has been advised to use glucose tablets to treat symptoms   Wt Readings from Last 3 Encounters:  07/29/19 207 lb 9.6 oz (94.2 kg)  02/18/19 204 lb 6.4 oz (92.7 kg)  10/20/18 204 lb 12.8 oz (92.9 kg)    Lab Results  Component Value Date   HGBA1C 6.6 (H) 07/27/2019    HGBA1C 6.5 02/16/2019   HGBA1C 6.1 (A) 10/20/2018   Lab Results  Component Value Date   MICROALBUR <0.7 10/20/2018   [place] 70 07/27/2019   CREATININE 1.01 07/27/2019     Allergies as of 07/29/2019   No Known Allergies     Medication List       Accurate as of July 29, 2019  9:07 AM. If you have any questions,  ask your nurse or doctor.        acetaminophen 325 MG tablet Commonly known as: TYLENOL Take 650 mg by mouth every 6 (six) hours as needed. Reported on 11/09/2015   Bayer Microlet Lancets lancets Use as instructed to check your blood sugar 10 times per day   Contour Next Test test strip Generic drug: glucose blood USE TO CHECK BLOOD SUGAR 10 TIMES DAILY AS DIRECTED   folic acid 1 MG tablet Commonly known as: FOLVITE Take 1 mg by mouth daily.   insulin aspart 100 UNIT/ML injection Commonly known as: novoLOG INJECT A MAX OF 85 UNITS DAILY INTO PUMP   insulin glargine 100 UNIT/ML injection Commonly known as: Lantus USE AS DIRECTED UP TO 55 UNITS DAILY   multivitamin tablet Take 1 tablet by mouth daily.   pravastatin 40 MG tablet Commonly known as: PRAVACHOL TAKE 1 TABLET BY MOUTH DAILY       Allergies: No Known Allergies  Past Medical History:  Diagnosis Date  . Cancer ([place])   . Diabetes mellitus     History reviewed. No pertinent surgical history.  Family History  Problem Relation Age of Onset  . Diabetes Paternal Aunt   . Heart disease Paternal Grandfather     Social History:  reports that he has never smoked. He has never used smokeless tobacco. He reports that he does not drink alcohol. No history on file for drug.  REVIEW of systems:   HYPERLIPIDEMIA: Baseline LDL has been below 100 Has been started on pravastatin for cardiovascular protection LDL has been excellent   Lab Results  Component Value Date   CHOL 135 07/27/2019   HDL 55.70 07/27/2019   LDLCALC 70 07/27/2019   LDLDIRECT 100.0 12/18/2013   TRIG 47.0 07/27/2019    CHOLHDL 2 07/27/2019   He has no significant numbness or tingling in his feet, previously had symptoms that started after his chemotherapy several years ago CBC has been followed by PCP   Diabetic foot in 10/20 was normal  No evidence of retinopathy on exams   EXAM:  BP 110/70 (BP Location: Left Arm, Patient Position: Sitting, Cuff Size: Normal)   Pulse 75   Ht 6' (1.829 m)   Wt 207 lb 9.6 oz (94.2 kg)   SpO2 98%   BMI 28.16 kg/m     ASSESSMENT:  Type 1 diabetes, on insulin pump, overall well controlled  See history of present illness for detailed discussion of current diabetes management, blood sugar patterns and problems identified  A1c is about the same at 6.6  He is still having some variability in his blood sugars related to postprandial blood sugars along with tendency to rebound hypoglycemia late in the evenings after 10-11 PM Sometimes will have lower postprandial readings after his evening meal especially after 8 PM and then blood sugars will rebound Otherwise carbohydrate ratio and active insulin time appears to be appropriate Some of the low sugar may be related to overcorrection also Calibration issues occurred 4 times causing auto mode exits  Recommendations:  Bolus  15 to 20-minute before each meal with NovoLog  He can try Fiasp again for the next 3 months and prescription will be sent, he can use this right before eating  He will continue to work with [employer] regarding any issues with his sensor and calibration issues  He will try to use the auto mode more often and not exit manually as much  If he does need to use a temporary basal to  bring his sugar down he can only use it for 1 to 2 hours and not longer  Correction factor I: 45 during the day  Carbohydrate ratio 1:14 all evening  LIPIDS: Excellent control with LDL 70  Follow-up in 4 months again    Patient Instructions  Carb ratio 1:14 at 9 pm also       Elayne Snare

## 2019-07-29 ENCOUNTER — Encounter: Payer: Self-pay | Admitting: Endocrinology

## 2019-07-29 ENCOUNTER — Ambulatory Visit (INDEPENDENT_AMBULATORY_CARE_PROVIDER_SITE_OTHER): Payer: BLUE CROSS/BLUE SHIELD | Admitting: Endocrinology

## 2019-07-29 ENCOUNTER — Other Ambulatory Visit: Payer: Self-pay

## 2019-07-29 ENCOUNTER — Other Ambulatory Visit: Payer: Self-pay | Admitting: Endocrinology

## 2019-07-29 VITALS — BP 110/70 | HR 75 | Ht 72.0 in | Wt 207.6 lb

## 2019-07-29 DIAGNOSIS — E7800 Pure hypercholesterolemia, unspecified: Secondary | ICD-10-CM

## 2019-07-29 DIAGNOSIS — E1065 Type 1 diabetes mellitus with hyperglycemia: Secondary | ICD-10-CM | POA: Diagnosis not present

## 2019-07-29 MED ORDER — FIASP 100 UNIT/ML ~~LOC~~ SOLN: 85.0000 [IU] | Freq: Every day | SUBCUTANEOUS | 3 refills | Status: DC

## 2019-07-29 NOTE — Patient Instructions (Addendum)
Carb ratio 1:14 at 9 pm also  Correction factor 1:45 at 7 am

## 2019-07-30 ENCOUNTER — Other Ambulatory Visit: Payer: Self-pay | Admitting: Endocrinology

## 2019-09-03 DIAGNOSIS — E109 Type 1 diabetes mellitus without complications: Secondary | ICD-10-CM | POA: Diagnosis not present

## 2019-10-07 DIAGNOSIS — E109 Type 1 diabetes mellitus without complications: Secondary | ICD-10-CM | POA: Diagnosis not present

## 2019-10-20 DIAGNOSIS — E109 Type 1 diabetes mellitus without complications: Secondary | ICD-10-CM | POA: Diagnosis not present

## 2019-10-20 DIAGNOSIS — E1065 Type 1 diabetes mellitus with hyperglycemia: Secondary | ICD-10-CM | POA: Diagnosis not present

## 2019-10-20 DIAGNOSIS — Z794 Long term (current) use of insulin: Secondary | ICD-10-CM | POA: Diagnosis not present

## 2019-11-16 DIAGNOSIS — E663 Overweight: Secondary | ICD-10-CM | POA: Diagnosis not present

## 2019-11-16 DIAGNOSIS — M546 Pain in thoracic spine: Secondary | ICD-10-CM | POA: Diagnosis not present

## 2019-11-16 DIAGNOSIS — E109 Type 1 diabetes mellitus without complications: Secondary | ICD-10-CM | POA: Diagnosis not present

## 2019-11-16 DIAGNOSIS — C8590 Non-Hodgkin lymphoma, unspecified, unspecified site: Secondary | ICD-10-CM | POA: Diagnosis not present

## 2019-11-23 ENCOUNTER — Other Ambulatory Visit: Payer: Self-pay | Admitting: Endocrinology

## 2019-11-30 ENCOUNTER — Ambulatory Visit: Payer: BLUE CROSS/BLUE SHIELD | Admitting: Endocrinology

## 2019-11-30 ENCOUNTER — Other Ambulatory Visit (INDEPENDENT_AMBULATORY_CARE_PROVIDER_SITE_OTHER): Payer: BLUE CROSS/BLUE SHIELD

## 2019-11-30 ENCOUNTER — Other Ambulatory Visit: Payer: Self-pay

## 2019-11-30 DIAGNOSIS — E1065 Type 1 diabetes mellitus with hyperglycemia: Secondary | ICD-10-CM | POA: Diagnosis not present

## 2019-11-30 LAB — BASIC METABOLIC PANEL
BUN: 17 mg/dL (ref 6–23)
CO2: 30 mEq/L (ref 19–32)
Calcium: 9 mg/dL (ref 8.4–10.5)
Chloride: 103 mEq/L (ref 96–112)
Creatinine, Ser: 1.04 mg/dL (ref 0.40–1.50)
GFR: 74.19 mL/min (ref >60.00)
Glucose, Bld: 128 mg/dL — ABNORMAL HIGH (ref 70–99)
Potassium: 4.4 mEq/L (ref 3.5–5.1)
Sodium: 137 mEq/L (ref 135–145)

## 2019-11-30 LAB — MICROALBUMIN / CREATININE URINE RATIO
Creatinine,U: 97.1 mg/dL
Microalb Creat Ratio: 0.7 mg/g (ref 0.0–30.0)
Microalb, Ur: <0.7 mg/dL (ref 0.0–1.9)

## 2019-11-30 LAB — HEMOGLOBIN A1C: Hgb A1c MFr Bld: 6.6 % — ABNORMAL HIGH (ref 4.6–6.5)

## 2019-12-02 ENCOUNTER — Encounter: Payer: Self-pay | Admitting: Endocrinology

## 2019-12-02 ENCOUNTER — Other Ambulatory Visit: Payer: Self-pay

## 2019-12-02 ENCOUNTER — Ambulatory Visit (INDEPENDENT_AMBULATORY_CARE_PROVIDER_SITE_OTHER): Payer: BLUE CROSS/BLUE SHIELD | Admitting: Endocrinology

## 2019-12-02 VITALS — BP 110/72 | HR 59 | Ht 72.0 in | Wt 201.8 lb

## 2019-12-02 DIAGNOSIS — E1065 Type 1 diabetes mellitus with hyperglycemia: Secondary | ICD-10-CM | POA: Diagnosis not present

## 2019-12-02 NOTE — Progress Notes (Signed)
Patient ID: Corey Hudson, male   DOB: 1965-09-16, 54 y.o.   MRN: [id number]   Chief complaint: Type 1 diabetes followup:   CURRENT insulin pump brand:  Medtronic  [id number]  Prior history: He has a long-standing type 1 diabetes which is usually well-controlled Previously A1c readings have been as low as 5.8 although was having more hypoglycemia in the past  HISTORY:   The pump SETTINGS are: Basal rate: 1.25, midnight-2:30 a.m.,2:30 a.m. = 0.90, 5 AM = 0.65 10 AM = 0.5, 7:30 PM = 0.75   Carb Ratio: 1:10 at breakfast, lunch 1: 12, 5-9 PM = 1: 14 and 9 PM = 1: 12  Correction factor 1:40 during the day and 1:50 at night with target 100 -110 and active insulin 3 hours     His A1c is again normal and stable at 6.6    In the last 2 weeks he has been in the AUTO mode 21 % of the time   He did not have a sensor continuously over the last 4 weeks and not enough data available  he is since last visit doing Fiasp in his pump, previously working NovoLog 15 to 20-minute before eating  also was told to reduce his carbohydrate coverage to 14 at dinnertime which previously was tending to cause lower readings right after the meal and subsequently rebound  He said that he has not like to use the auto mode while the time of this sometimes it does not seem to improve his control as much is manual mode  also when he is very active he is usually needing to eat more carbohydrates in addition to using the temporary target  Some of his hyperglycemia may occur when he is having difficulty with his infusion site and he thinks that instead may sometimes go back with him being in a hot environment frequently at work  Fluctuation in his blood sugars is overall minimal now with standard deviation 47, generally better early morning  Hypoglycemia has been occasional, sometimes related to excessive bolus, other times increased activity  His mealtimes usually 8-9 AM for breakfast, 12 PM-2 PM for lunch  and usually 7 PM for dinner  CGM use % of time  48  2-week average/SD  134+/-47  Time in range    84    %  % Time Above 180  12  % Time above 250   % Time Below 70  4     PRE-MEAL Fasting Lunch Dinner Bedtime Overall  Glucose range:       Averages:  132  119  132  173/167    POST-MEAL PC Breakfast PC Lunch PC Dinner  Glucose range:     Averages:  117  124  125    Previous data:    CGM use % of time  79  Average and SD  151+/-54  Time in range  71      %  % Time Above 180  21  % Time above 250 5  % Time Below target  3    PRE-MEAL Fasting Lunch Dinner Bedtime Overall  Glucose range:       Mean/median:  124  138  139   151   POST-MEAL PC Breakfast PC Lunch PC Dinner  Glucose range:     Mean/median:  125  148  158    Glycemic patterns summary:   Hyperglycemic episodes are occurring mostly late at night with rebounding from low normal or low  sugars but also sporadically in the early afternoons   Postprandial periods: Overall blood sugars at 3 hours are fairly flat compared to 1-2 hours after the meal   After breakfast: Blood sugars are usually fairly stable and about the same before and after the meals After lunch: Blood sugars tend to be higher about 1 hour postprandially but on an average fairly good by 2 hours occasionally with low normal readings After dinner: Blood sugars after meals are quite variable and periodically may be low about 1 hour later    Hypoglycemia: He gets symptoms of confusion, feeling sleepy, anxious and at night will wake up with strange dreams and anxiety. Blood sugars may be in the 40s 50s when he has a low sugar, has been advised to use glucose tablets to treat symptoms   Wt Readings from Last 3 Encounters:  12/02/19 201 lb 12.8 oz (91.5 kg)  07/29/19 207 lb 9.6 oz (94.2 kg)  02/18/19 204 lb 6.4 oz (92.7 kg)    Lab Results  Component Value Date   HGBA1C 6.6 (H) 11/30/2019   HGBA1C 6.6 (H) 07/27/2019   HGBA1C 6.5 02/16/2019    Lab Results  Component Value Date   MICROALBUR <0.7 11/30/2019   [place] 70 07/27/2019   CREATININE 1.04 11/30/2019     Allergies as of 12/02/2019   No Known Allergies     Medication List       Accurate as of December 02, 2019  8:46 AM. If you have any questions, ask your nurse or doctor.        acetaminophen 325 MG tablet Commonly known as: TYLENOL Take 650 mg by mouth every 6 (six) hours as needed. Reported on 11/09/2015   Bayer Microlet Lancets lancets Use as instructed to check your blood sugar 10 times per day   Contour Next Test test strip Generic drug: glucose blood USE TO CHECK BLOOD SUGAR 10 TIMES DAILY AS DIRECTED   Fiasp 100 UNIT/ML Soln Generic drug: Insulin Aspart (w/Niacinamide) USE A MAX OF 85 UNITS ONCE DAILY IN PUMP   folic acid 1 MG tablet Commonly known as: FOLVITE Take 1 mg by mouth daily.   insulin aspart 100 UNIT/ML injection Commonly known as: novoLOG INJECT A MAX OF 85 UNITS DAILY INTO PUMP   insulin glargine 100 UNIT/ML injection Commonly known as: Lantus USE AS DIRECTED UP TO 55 UNITS DAILY   multivitamin tablet Take 1 tablet by mouth daily.   pravastatin 40 MG tablet Commonly known as: PRAVACHOL TAKE 1 TABLET BY MOUTH DAILY       Allergies: No Known Allergies  Past Medical History:  Diagnosis Date  . Cancer ([place])   . Diabetes mellitus     History reviewed. No pertinent surgical history.  Family History  Problem Relation Age of Onset  . Diabetes Paternal Aunt   . Heart disease Paternal Grandfather     Social History:  reports that he has never smoked. He has never used smokeless tobacco. He reports that he does not drink alcohol. No history on file for drug use.  REVIEW of systems:   HYPERLIPIDEMIA: Baseline LDL has been below 100 Has been on pravastatin for cardiovascular protection LDL has been excellent   Lab Results  Component Value Date   CHOL 135 07/27/2019   HDL 55.70 07/27/2019   LDLCALC 70  07/27/2019   LDLDIRECT 100.0 12/18/2013   TRIG 47.0 07/27/2019   CHOLHDL 2 07/27/2019   He has no significant numbness or tingling in his  feet, previously had symptoms that started after his chemotherapy several years ago   Diabetic foot in 10/20 was normal  No evidence of retinopathy on exams   EXAM:  BP 110/72 (BP Location: Left Arm, Patient Position: Sitting, Cuff Size: Normal)   Pulse (!) 59   Ht 6' (1.829 m)   Wt 201 lb 12.8 oz (91.5 kg)   SpO2 97%   BMI 27.37 kg/m     ASSESSMENT:  Type 1 diabetes, on insulin pump, overall well controlled  See history of present illness for detailed discussion of current diabetes management, blood sugar patterns and problems identified Insulin pump information and CGM information was reviewed in detail    A1c is about the same at 6.6  He is having overall well controlled sugars especially when he is in the auto mode and using the sensor However has been irregular with using the CGM lately Occasionally he thinks he has issues with the infusion site or the insulin getting too hot Currently postprandial readings are appearing to be fairly well controlled and may be benefiting from more even control using Fiasp compared to NovoLog, taking less insulin at dinnertime compared to last time He does benefit from being very active which he has been lately also  Urine microalbumin normal  Recommendations:  Currently does not need any change in the settings  He will try to use the sensor consistently  Make sure he calibrates at bedtime also daily  Again use temporary target for being very active and additional carbohydrate when blood sugars are low normal  Counseling done regarding need and safety of Covid vaccine which he has been refusing to consider so far Patient information also given and encouraged him strongly to consider this for his safety    Patient Instructions  Temporary target for increased periods of  activity     Elayne Snare

## 2019-12-02 NOTE — Patient Instructions (Signed)
Temporary target for increased periods of activity

## 2019-12-11 DIAGNOSIS — D3131 Benign neoplasm of right choroid: Secondary | ICD-10-CM | POA: Diagnosis not present

## 2019-12-11 DIAGNOSIS — E109 Type 1 diabetes mellitus without complications: Secondary | ICD-10-CM | POA: Diagnosis not present

## 2019-12-22 DIAGNOSIS — Z794 Long term (current) use of insulin: Secondary | ICD-10-CM | POA: Diagnosis not present

## 2019-12-22 DIAGNOSIS — E109 Type 1 diabetes mellitus without complications: Secondary | ICD-10-CM | POA: Diagnosis not present

## 2019-12-22 DIAGNOSIS — E1065 Type 1 diabetes mellitus with hyperglycemia: Secondary | ICD-10-CM | POA: Diagnosis not present

## 2020-01-21 DIAGNOSIS — E109 Type 1 diabetes mellitus without complications: Secondary | ICD-10-CM | POA: Diagnosis not present

## 2020-02-22 DIAGNOSIS — E109 Type 1 diabetes mellitus without complications: Secondary | ICD-10-CM | POA: Diagnosis not present

## 2020-02-24 ENCOUNTER — Other Ambulatory Visit: Payer: Self-pay | Admitting: Endocrinology

## 2020-03-23 ENCOUNTER — Other Ambulatory Visit: Payer: Self-pay | Admitting: Endocrinology

## 2020-03-23 DIAGNOSIS — Z794 Long term (current) use of insulin: Secondary | ICD-10-CM | POA: Diagnosis not present

## 2020-03-23 DIAGNOSIS — E1065 Type 1 diabetes mellitus with hyperglycemia: Secondary | ICD-10-CM | POA: Diagnosis not present

## 2020-03-23 DIAGNOSIS — E109 Type 1 diabetes mellitus without complications: Secondary | ICD-10-CM | POA: Diagnosis not present

## 2020-04-29 DIAGNOSIS — E109 Type 1 diabetes mellitus without complications: Secondary | ICD-10-CM | POA: Diagnosis not present

## 2020-05-17 DIAGNOSIS — Z125 Encounter for screening for malignant neoplasm of prostate: Secondary | ICD-10-CM | POA: Diagnosis not present

## 2020-05-17 DIAGNOSIS — E109 Type 1 diabetes mellitus without complications: Secondary | ICD-10-CM | POA: Diagnosis not present

## 2020-05-17 DIAGNOSIS — Z0000 Encounter for general adult medical examination without abnormal findings: Secondary | ICD-10-CM | POA: Diagnosis not present

## 2020-05-17 DIAGNOSIS — R7989 Other specified abnormal findings of blood chemistry: Secondary | ICD-10-CM | POA: Diagnosis not present

## 2020-05-25 DIAGNOSIS — Z0000 Encounter for general adult medical examination without abnormal findings: Secondary | ICD-10-CM | POA: Diagnosis not present

## 2020-05-25 DIAGNOSIS — R82998 Other abnormal findings in urine: Secondary | ICD-10-CM | POA: Diagnosis not present

## 2020-05-25 DIAGNOSIS — E109 Type 1 diabetes mellitus without complications: Secondary | ICD-10-CM | POA: Diagnosis not present

## 2020-05-26 DIAGNOSIS — Z1212 Encounter for screening for malignant neoplasm of rectum: Secondary | ICD-10-CM | POA: Diagnosis not present

## 2020-06-01 ENCOUNTER — Other Ambulatory Visit (HOSPITAL_COMMUNITY): Payer: Self-pay | Admitting: Internal Medicine

## 2020-06-01 DIAGNOSIS — H5462 Unqualified visual loss, left eye, normal vision right eye: Secondary | ICD-10-CM

## 2020-06-02 ENCOUNTER — Other Ambulatory Visit: Payer: Self-pay

## 2020-06-02 ENCOUNTER — Ambulatory Visit (HOSPITAL_COMMUNITY)
Admission: RE | Admit: 2020-06-02 | Discharge: 2020-06-02 | Disposition: A | Discharge status: Home / Self Care | Payer: BC Managed Care – PPO | Source: Ambulatory Visit | Attending: Internal Medicine | Admitting: Internal Medicine

## 2020-06-02 DIAGNOSIS — H5462 Unqualified visual loss, left eye, normal vision right eye: Secondary | ICD-10-CM | POA: Diagnosis not present

## 2020-06-03 ENCOUNTER — Telehealth: Payer: Self-pay | Admitting: Endocrinology

## 2020-06-03 NOTE — Telephone Encounter (Signed)
Pt called because Dr.Kumar recently switched him from Novolog to [place] and pt states he tried it and is not liking it. Pt requests to be switched back to Novolog. Please send prescription to   [facility], [address] Phone:  [phone]  Fax:  [phone]

## 2020-06-04 NOTE — Telephone Encounter (Signed)
Please advise 

## 2020-06-05 ENCOUNTER — Other Ambulatory Visit: Payer: Self-pay | Admitting: Endocrinology

## 2020-06-05 MED ORDER — INSULIN ASPART 100 UNIT/ML ~~LOC~~ SOLN: SUBCUTANEOUS | 5 refills | Status: DC

## 2020-06-05 NOTE — Telephone Encounter (Signed)
90-day Novolog prescription sent

## 2020-06-07 ENCOUNTER — Other Ambulatory Visit (INDEPENDENT_AMBULATORY_CARE_PROVIDER_SITE_OTHER): Payer: BC Managed Care – PPO

## 2020-06-07 ENCOUNTER — Other Ambulatory Visit: Payer: Self-pay

## 2020-06-07 DIAGNOSIS — E1065 Type 1 diabetes mellitus with hyperglycemia: Secondary | ICD-10-CM | POA: Diagnosis not present

## 2020-06-07 LAB — COMPREHENSIVE METABOLIC PANEL
ALT: 20 U/L (ref 0–53)
AST: 20 U/L (ref 0–37)
Albumin: 4.1 g/dL (ref 3.5–5.2)
Alkaline Phosphatase: 56 U/L (ref 39–117)
BUN: 18 mg/dL (ref 6–23)
CO2: 31 mEq/L (ref 19–32)
Calcium: 9.1 mg/dL (ref 8.4–10.5)
Chloride: 103 mEq/L (ref 96–112)
Creatinine, Ser: 1.01 mg/dL (ref 0.40–1.50)
GFR: 83.82 mL/min (ref >60.00)
Glucose, Bld: 169 mg/dL — ABNORMAL HIGH (ref 70–99)
Potassium: 4.2 mEq/L (ref 3.5–5.1)
Sodium: 137 mEq/L (ref 135–145)
Total Bilirubin: 0.7 mg/dL (ref 0.2–1.2)
Total Protein: 6.7 g/dL (ref 6.0–8.3)

## 2020-06-07 LAB — HEMOGLOBIN A1C: Hgb A1c MFr Bld: 6.6 % — ABNORMAL HIGH (ref 4.6–6.5)

## 2020-06-09 ENCOUNTER — Ambulatory Visit: Payer: BLUE CROSS/BLUE SHIELD | Admitting: Endocrinology

## 2020-06-10 DIAGNOSIS — E109 Type 1 diabetes mellitus without complications: Secondary | ICD-10-CM | POA: Diagnosis not present

## 2020-06-19 NOTE — Progress Notes (Signed)
Patient ID: Corey Hudson, male   DOB: 05-29-1965, 55 y.o.   MRN: [id number]   Chief complaint: Type 1 diabetes followup:   CURRENT insulin pump brand:  Medtronic  670  Prior history: He has a long-standing type 1 diabetes which is usually well-controlled Previously A1c readings have been as low as 5.8 although was having more hypoglycemia in the past  HISTORY:   The pump SETTINGS are: Basal rate: 1.25, midnight-2:30 a.m.,2:30 a.m. = 0.90, 5 AM = 0.65 10 AM = 0.5, 7:30 PM = 0.75   Carb Ratio: 1:10 at breakfast, lunch 1: 12, 5-9 PM = 1: 14 and 9 PM = 1: 12  Correction factor 1:40 during the day and 1:50 at night with target 100 -110 and active insulin 3 hours     His A1c is again normal and stable at 6.6    In the last 2 weeks he has been in the AUTO mode 80% of the time   He did not have a sensor continuously over the last 4 weeks and not enough data available  he is since last visit not doing Fiasp in his pump since after some time he felt that he was not working consistently and after 2 days of usage his blood sugars would go up to 300 as well as requiring more insulin  However with NovoLog he still appears to be sometimes getting relatively high readings before his bolus clicks and  He does tend to get hypoglycemia when he is more active including when he is doing more physical activity at work  He is only using the temporary target when he is doing an exercise level of activity during the summer and not during his workday  Also occasionally such as yesterday does self correction boluses when his blood sugar goes up postprandially but will also add more carbohydrates to get a higher correction amount  With this he has tendency to low sugars before lunch and dinner more often    His mealtimes usually 8-9 AM for breakfast, 12 PM-2 PM for lunch and usually 7 PM for dinner  Download of his CGM was reviewed and interpreted as follows   HIGHEST blood sugars  on average are and 1-2 AM but lowest readings are between 6-7 PM  Also has more variability in blood sugars between lunch and dinner  HYPERGLYCEMIA episodes are occasionally occurring postprandially but also with sugars trending to rise after 10 PM until 2-3 AM. On average has 2.1 episodes per day most often at midnight, 1 AM and 1 PM with 3-4 episodes at each time  HYPOGLYCEMIA has occurred 1.1 episodes per day occurring at 6-7 PM, before 12 noon and between 12 PM-1 PM  Postprandial blood sugars are variable but modestly increased only at lunchtime, after dinner they are either high or low about half the time each but usually well controlled after breakfast and late night postprandially  Preprandial readings are about 140 at lunch and dinner. Better at breakfast  CGM use % of time  83  2-week average/GV  137/50  Time in range     77   %  % Time Above 180  15+2  % Time above 250   % Time Below 70/50  4+2     PRE-MEAL Fasting Lunch Dinner Bedtime Overall  Glucose range:        Averages:  133  143 146   137   POST-MEAL PC Breakfast PC Lunch PC Dinner  Glucose range:  Averages:  133  175  137     PREVIOUSLY:  CGM use % of time  48  2-week average/SD  134+/-47  Time in range    84    %  % Time Above 180  12  % Time above 250   % Time Below 70  4     PRE-MEAL Fasting Lunch Dinner Bedtime Overall  Glucose range:       Averages:  132  119  132  173/167    POST-MEAL PC Breakfast PC Lunch PC Dinner  Glucose range:     Averages:  117  124  125       Hypoglycemia: With hypoglycemia he gets symptoms of confusion, feeling sleepy, anxious and at night will wake up with strange dreams and anxiety. Recently most symptoms are is related to anxiety Blood sugars may be in the 40s or 50s when he has a low sugar, currently not having a glucagon kit but had it in the past   Wt Readings from Last 3 Encounters:  06/20/20 199 lb (90.3 kg)  12/02/19 201 lb 12.8 oz (91.5 kg)   07/29/19 207 lb 9.6 oz (94.2 kg)    Lab Results  Component Value Date   HGBA1C 6.6 (H) 06/07/2020   HGBA1C 6.6 (H) 11/30/2019   HGBA1C 6.6 (H) 07/27/2019   Lab Results  Component Value Date   MICROALBUR <0.7 11/30/2019   LDLCALC 70 07/27/2019   CREATININE 1.01 06/07/2020     Allergies as of 06/20/2020   No Known Allergies     Medication List       Accurate as of June 20, 2020  8:27 AM. If you have any questions, ask your nurse or doctor.        acetaminophen 325 MG tablet Commonly known as: TYLENOL Take 650 mg by mouth every 6 (six) hours as needed. Reported on 11/09/2015   Bayer Microlet Lancets lancets Use as instructed to check your blood sugar 10 times per day   Contour Next Test test strip Generic drug: glucose blood USE TO CHECK BLOOD SUGAR 10 TIMES DAILY AS DIRECTED   folic acid 1 MG tablet Commonly known as: FOLVITE Take 1 mg by mouth daily.   insulin aspart 100 UNIT/ML injection Commonly known as: novoLOG INJECT A MAX OF 85 UNITS DAILY INTO PUMP   insulin glargine 100 UNIT/ML injection Commonly known as: Lantus USE AS DIRECTED UP TO 55 UNITS DAILY   multivitamin tablet Take 1 tablet by mouth daily.   pravastatin 40 MG tablet Commonly known as: PRAVACHOL TAKE 1 TABLET BY MOUTH DAILY       Allergies: No Known Allergies  Past Medical History:  Diagnosis Date  . Cancer ([place])   . Diabetes mellitus     No past surgical history on file.  Family History  Problem Relation Age of Onset  . Diabetes Paternal Aunt   . Heart disease Paternal Grandfather     Social History:  reports that he has never smoked. He has never used smokeless tobacco. He reports that he does not drink alcohol. No history on file for drug use.  REVIEW of systems:   HYPERLIPIDEMIA: Baseline LDL has been below 100 Has been on pravastatin for cardiovascular protection LDL has been consistently controlled LDL was 82 in 04/2020 by PCP  Lab Results  Component  Value Date   CHOL 135 07/27/2019   HDL 55.70 07/27/2019   LDLCALC 70 07/27/2019   LDLDIRECT 100.0 12/18/2013  TRIG 47.0 07/27/2019   CHOLHDL 2 07/27/2019   He has no significant numbness or tingling in his feet, previously had symptoms that started after his chemotherapy several years ago   Diabetic foot in 2/22 was normal  No evidence of retinopathy on exams done annually  He had an episode of amaurosis in the left eye preceded by getting up from a prone position and also was a hot day last summer. He was advised aspirin by his PCP but has not started it yet. Vascular study on his carotids was normal   EXAM:  BP 126/82   Pulse 60   Ht 6' 1"  (1.854 m)   Wt 199 lb (90.3 kg)   SpO2 98%   BMI 26.25 kg/m     ASSESSMENT:  Type 1 diabetes, on insulin pump, overall well controlled  See history of present illness for detailed discussion of current diabetes management, blood sugar patterns and problems identified Insulin pump information and CGM information was reviewed in detail    A1c is about the same at 6.6  Although overall blood sugars are well controlled he does have about 6% of his readings below 70 Also has some tendency to hypoglycemia unawareness With this discussed that his blood sugar control may be too tight He is slightly making too many corrections for high readings Also not using temporary target when he is more active at work which frequently drops his sugar before lunch and dinner  Postprandial readings are fairly good although somewhat inconsistent, may be higher when he is eating out  Foot exam normal  LIPIDS: Usually well-controlled with pravastatin which he needs to take long-term  Recommendations:  Carbohydrate coverage 1: 15 at dinnertime to avoid low sugars  He should not put an extra carbohydrates when he is doing a correction bolus unless eating  Temporary target when active advised  He will upgrade to the 780 pump when available and  discussed the benefits of this  He will discussed the cost of his sensors with the company  May take aspirin 81 mg daily with food  Follow-up in 6 months    There are no Patient Instructions on file for this visit.    Elayne Snare

## 2020-06-20 ENCOUNTER — Encounter: Payer: Self-pay | Admitting: Endocrinology

## 2020-06-20 ENCOUNTER — Ambulatory Visit (INDEPENDENT_AMBULATORY_CARE_PROVIDER_SITE_OTHER): Payer: BC Managed Care – PPO | Admitting: Endocrinology

## 2020-06-20 ENCOUNTER — Other Ambulatory Visit: Payer: Self-pay

## 2020-06-20 VITALS — BP 126/82 | HR 60 | Ht 73.0 in | Wt 199.0 lb

## 2020-06-20 DIAGNOSIS — E1065 Type 1 diabetes mellitus with hyperglycemia: Secondary | ICD-10-CM

## 2020-06-20 DIAGNOSIS — E7800 Pure hypercholesterolemia, unspecified: Secondary | ICD-10-CM

## 2020-06-20 NOTE — Patient Instructions (Signed)
81mg  aspirin at dinner  Temp target when active at work  No extra carbs entry for correction doses

## 2020-07-22 ENCOUNTER — Other Ambulatory Visit: Payer: Self-pay | Admitting: Endocrinology

## 2020-07-23 DIAGNOSIS — E109 Type 1 diabetes mellitus without complications: Secondary | ICD-10-CM | POA: Diagnosis not present

## 2020-07-23 DIAGNOSIS — Z794 Long term (current) use of insulin: Secondary | ICD-10-CM | POA: Diagnosis not present

## 2020-07-23 DIAGNOSIS — E1065 Type 1 diabetes mellitus with hyperglycemia: Secondary | ICD-10-CM | POA: Diagnosis not present

## 2020-08-18 DIAGNOSIS — H5203 Hypermetropia, bilateral: Secondary | ICD-10-CM | POA: Diagnosis not present

## 2020-08-22 DIAGNOSIS — E1065 Type 1 diabetes mellitus with hyperglycemia: Secondary | ICD-10-CM | POA: Diagnosis not present

## 2020-08-22 DIAGNOSIS — Z794 Long term (current) use of insulin: Secondary | ICD-10-CM | POA: Diagnosis not present

## 2020-08-22 DIAGNOSIS — E109 Type 1 diabetes mellitus without complications: Secondary | ICD-10-CM | POA: Diagnosis not present

## 2020-10-05 DIAGNOSIS — E785 Hyperlipidemia, unspecified: Secondary | ICD-10-CM | POA: Diagnosis not present

## 2020-10-05 DIAGNOSIS — E109 Type 1 diabetes mellitus without complications: Secondary | ICD-10-CM | POA: Diagnosis not present

## 2020-10-24 DIAGNOSIS — E1065 Type 1 diabetes mellitus with hyperglycemia: Secondary | ICD-10-CM | POA: Diagnosis not present

## 2020-10-24 DIAGNOSIS — E109 Type 1 diabetes mellitus without complications: Secondary | ICD-10-CM | POA: Diagnosis not present

## 2020-10-24 DIAGNOSIS — Z794 Long term (current) use of insulin: Secondary | ICD-10-CM | POA: Diagnosis not present

## 2020-11-18 ENCOUNTER — Other Ambulatory Visit: Payer: Self-pay | Admitting: Endocrinology

## 2020-11-18 NOTE — Telephone Encounter (Signed)
Pt needs a refill on the medication below (pravastatin) to [facility].

## 2020-11-28 DIAGNOSIS — E109 Type 1 diabetes mellitus without complications: Secondary | ICD-10-CM | POA: Diagnosis not present

## 2020-12-15 ENCOUNTER — Other Ambulatory Visit: Payer: Self-pay

## 2020-12-15 ENCOUNTER — Other Ambulatory Visit (INDEPENDENT_AMBULATORY_CARE_PROVIDER_SITE_OTHER): Payer: BC Managed Care – PPO

## 2020-12-15 DIAGNOSIS — E1065 Type 1 diabetes mellitus with hyperglycemia: Secondary | ICD-10-CM | POA: Diagnosis not present

## 2020-12-15 LAB — HEMOGLOBIN A1C: Hgb A1c MFr Bld: 6.7 % — ABNORMAL HIGH (ref 4.6–6.5)

## 2020-12-15 LAB — COMPREHENSIVE METABOLIC PANEL
ALT: 23 U/L (ref 0–53)
AST: 25 U/L (ref 0–37)
Albumin: 4.3 g/dL (ref 3.5–5.2)
Alkaline Phosphatase: 50 U/L (ref 39–117)
BUN: 17 mg/dL (ref 6–23)
CO2: 32 mEq/L (ref 19–32)
Calcium: 9.4 mg/dL (ref 8.4–10.5)
Chloride: 102 mEq/L (ref 96–112)
Creatinine, Ser: 1.06 mg/dL (ref 0.40–1.50)
GFR: 78.81 mL/min (ref >60.00)
Glucose, Bld: 156 mg/dL — ABNORMAL HIGH (ref 70–99)
Potassium: 4.5 mEq/L (ref 3.5–5.1)
Sodium: 138 mEq/L (ref 135–145)
Total Bilirubin: 1 mg/dL (ref 0.2–1.2)
Total Protein: 6.7 g/dL (ref 6.0–8.3)

## 2020-12-15 LAB — MICROALBUMIN / CREATININE URINE RATIO
Creatinine,U: 81.2 mg/dL
Microalb Creat Ratio: 0.9 mg/g (ref 0.0–30.0)
Microalb, Ur: <0.7 mg/dL (ref 0.0–1.9)

## 2020-12-19 ENCOUNTER — Ambulatory Visit: Payer: BC Managed Care – PPO | Admitting: Endocrinology

## 2020-12-20 ENCOUNTER — Encounter: Payer: Self-pay | Admitting: Endocrinology

## 2020-12-20 ENCOUNTER — Other Ambulatory Visit: Payer: Self-pay

## 2020-12-20 ENCOUNTER — Ambulatory Visit: Payer: BC Managed Care – PPO | Admitting: Endocrinology

## 2020-12-20 ENCOUNTER — Ambulatory Visit (INDEPENDENT_AMBULATORY_CARE_PROVIDER_SITE_OTHER): Payer: BC Managed Care – PPO | Admitting: Endocrinology

## 2020-12-20 VITALS — BP 128/86 | HR 86 | Ht 73.0 in | Wt 200.0 lb

## 2020-12-20 DIAGNOSIS — E1065 Type 1 diabetes mellitus with hyperglycemia: Secondary | ICD-10-CM

## 2020-12-20 DIAGNOSIS — E7800 Pure hypercholesterolemia, unspecified: Secondary | ICD-10-CM | POA: Diagnosis not present

## 2020-12-20 NOTE — Progress Notes (Signed)
  Patient ID: Corey Hudson, male   DOB: 10/12/1965, 54 y.o.   MRN: [id number]   Chief complaint: Type 1 diabetes followup:   CURRENT insulin pump brand:  Medtronic  670  Prior history: He has a long-standing type 1 diabetes which is usually well-controlled Previously A1c readings have been as low as 5.8 although was having more hypoglycemia in the past  HISTORY:   The pump SETTINGS are: Basal rate: 1.25, midnight-2:30 a.m.,2:30 a.m. = 0.90, 5 AM = 0.65 10 AM = 0.5, 7:30 PM = 0.75   Carb Ratio: 1:10 at breakfast, lunch 1: 12, 5-9 PM = 1: 14 and 9 PM = 1: 12  Correction factor 1:40 during the day and 1:50 at night with target 100 -110 and active insulin 3 hours   Current insulin is NovoLog   His A1c is  stable at 6.7 vs 6.6  In the last 2 weeks he has been in the AUTO mode 63 % of the time  He did not have a sensor continuously over the last 2 weeks  However usually is in the auto mode when he is on the sensor  He was told to change his carbohydrate coverage at dinnertime to 1: 15 but is still having the same ratio  Although he has not been consistently low with his dinner bolus he has an average of only 118 postprandially and some of this may be influenced by exercise He does think that he may get low overnight if he is not in the auto mode  His main difficulty is tending to get hypoglycemia with exercise lately Also has some hypoglycemia related to bolusing  He is periodically doing extra boluses for higher blood sugars by adding more carbohydrate in addition to the correction dose and this will periodically cause hypoglycemia  He is going to the [employer] for exercise, now he thinks that he does better with turning on the temporary target before he leaves home as well as having a carbohydrate snack, however still has some recent episodes of hypoglycemia with exercise   His mealtimes usually 8-9 AM for breakfast, 12 PM-2 PM for lunch and usually 7 PM for dinner  Download  of his CGM was reviewed and interpreted as follows  Overall blood sugar variability is reasonable with only standard deviation of 39  Current time in range is improved compared to last visit when it was 77  HIGHEST blood sugars on average are in the late afternoon or around 1-2 AM  Overnight blood sugars may be rising slightly around midnight but declining and staying steady until breakfast time  Hypoglycemic episodes have been usually transient and occur variably in the afternoon but more in the early evening usually preceded by bolus  POSTPRANDIAL readings are very well controlled after breakfast and lunch  Postprandial readings after dinner are somewhat variable but tend to be low normal or low between 1-2 hours after the meal  Postprandial readings 3 hours after meals are lower after breakfast, level at lunch and dinner compared to 2 hours reading Lowest postprandial readings are after dinner    CGM use % of time 68  2-week average/SD 146+/-39  Time in range    82    %  % Time Above 180 16+1  % Time above 250   % Time Below 70 1     PRE-MEAL Fasting Lunch Dinner Bedtime Overall  Glucose range:       Averages: 147 139 152       POST-MEAL PC Breakfast PC Lunch PC Dinner  Glucose range:     Averages: 153 182 118    Previously  CGM use % of time  83  2-week average/GV  137/50  Time in range     77   %  % Time Above 180  15+2  % Time above 250   % Time Below 70/50  4+2     PRE-MEAL Fasting Lunch Dinner Bedtime Overall  Glucose range:        Averages:  133  143 146   137   POST-MEAL PC Breakfast PC Lunch PC Dinner  Glucose range:     Averages:  133  175  137       Hypoglycemia: With hypoglycemia he gets symptoms of confusion, feeling sleepy, anxious and at night will wake up with strange dreams and anxiety. Recently most symptoms are is related to anxiety Blood sugars may be in the 40s or 50s when he has a low sugar, currently not having a glucagon kit but had it in  the past   Wt Readings from Last 3 Encounters:  12/20/20 200 lb (90.7 kg)  06/20/20 199 lb (90.3 kg)  12/02/19 201 lb 12.8 oz (91.5 kg)    Lab Results  Component Value Date   HGBA1C 6.7 (H) 12/15/2020   HGBA1C 6.6 (H) 06/07/2020   HGBA1C 6.6 (H) 11/30/2019   Lab Results  Component Value Date   MICROALBUR <0.7 12/15/2020   LDLCALC 70 07/27/2019   CREATININE 1.06 12/15/2020     Allergies as of 12/20/2020   No Known Allergies      Medication List        Accurate as of December 20, 2020  8:25 AM. If you have any questions, ask your nurse or doctor.          acetaminophen 325 MG tablet Commonly known as: TYLENOL Take 650 mg by mouth every 6 (six) hours as needed. Reported on 11/09/2015   Bayer Microlet Lancets lancets Use as instructed to check your blood sugar 10 times per day   Contour Next Test test strip Generic drug: glucose blood USE TO CHECK BLOOD SUGAR 10 TIMES DAILY AS DIRECTED   folic acid 1 MG tablet Commonly known as: FOLVITE Take 1 mg by mouth daily.   insulin aspart 100 UNIT/ML injection Commonly known as: novoLOG INJECT A MAX OF 85 UNITS DAILY INTO PUMP   insulin glargine 100 UNIT/ML injection Commonly known as: Lantus USE AS DIRECTED UP TO 55 UNITS DAILY   multivitamin tablet Take 1 tablet by mouth daily.   pravastatin 40 MG tablet Commonly known as: PRAVACHOL TAKE 1 TABLET BY MOUTH DAILY        Allergies: No Known Allergies  Past Medical History:  Diagnosis Date   Cancer ([place])    Diabetes mellitus     No past surgical history on file.  Family History  Problem Relation Age of Onset   Diabetes Paternal Aunt    Heart disease Paternal Grandfather     Social History:  reports that he has never smoked. He has never used smokeless tobacco. He reports that he does not drink alcohol. No history on file for drug use.  REVIEW of systems:   HYPERLIPIDEMIA: Baseline LDL has been below 100 Has been on pravastatin for  cardiovascular protection LDL has been consistently controlled LDL was 82 in 04/2020 by PCP  Lab Results  Component Value Date   CHOL 135 07/27/2019   HDL 55.70  07/27/2019   [place] 70 07/27/2019   LDLDIRECT 100.0 12/18/2013   TRIG 47.0 07/27/2019   CHOLHDL 2 07/27/2019   He has no numbness or tingling in his feet, previously had symptoms that started after his chemotherapy several years ago   Diabetic foot in 2/22 was normal  No evidence of retinopathy on exams done annually  He has been reluctant to take the COVID-vaccine Also he is reluctant to take the influenza vaccine  Blood pressure usually not high  BP Readings from Last 3 Encounters:  12/20/20 128/86  06/20/20 126/82  12/02/19 110/72     EXAM:  BP 128/86   Pulse 86   Ht 6' 1" (1.854 m)   Wt 200 lb (90.7 kg)   SpO2 99%   BMI 26.39 kg/m     ASSESSMENT:  Type 1 diabetes, on insulin pump, overall well controlled  See history of present illness for detailed discussion of current diabetes management, blood sugar patterns and problems identified Insulin pump information and CGM information was reviewed in detail    A1c is about the same at 6.7  Although he likes to get his A1c down close to 6% discussed that because of tendency to hypoglycemia he should be less aggressive with his bolus management He is still over bolusing for high readings at times Recently is trying to adjust his snacks, temporary target when he is exercising but still gets low at times  Also postprandial reading after dinner may be relatively low partly because of exercise  He has not been able to use the sensor consistently but if finds the auto mode consistently useful  He prefers NovoLog to [place]  LIPIDS: Usually well-controlled with pravastatin and will recheck on the next visit  Recommendations: Carbohydrate coverage 1: 14 after 9 p.m.  Basal rate at midnight = 1.0  If he tends to have inadequate correction from high sugars  consistently he can try 1: 35 sensitivity during the day instead of 40  Avoid entering extra carbohydrates when he is doing a correction bolus especially postprandially Temporary target to be started before leaving home for exercise  If he is planning to eat after exercise he can reduce his bolus also slightly Consider Lyumjev if covered next year  Recommend that he take the influenza vaccine and also the COVID vaccination series including Diamicron booster when available  Follow-up in 4 months    There are no Patient Instructions on file for this visit.    Elayne Snare

## 2020-12-26 DIAGNOSIS — E109 Type 1 diabetes mellitus without complications: Secondary | ICD-10-CM | POA: Diagnosis not present

## 2020-12-26 DIAGNOSIS — Z794 Long term (current) use of insulin: Secondary | ICD-10-CM | POA: Diagnosis not present

## 2020-12-26 DIAGNOSIS — E1065 Type 1 diabetes mellitus with hyperglycemia: Secondary | ICD-10-CM | POA: Diagnosis not present

## 2021-01-31 DIAGNOSIS — E1065 Type 1 diabetes mellitus with hyperglycemia: Secondary | ICD-10-CM | POA: Diagnosis not present

## 2021-01-31 DIAGNOSIS — E109 Type 1 diabetes mellitus without complications: Secondary | ICD-10-CM | POA: Diagnosis not present

## 2021-01-31 DIAGNOSIS — Z794 Long term (current) use of insulin: Secondary | ICD-10-CM | POA: Diagnosis not present

## 2021-02-15 DIAGNOSIS — C8590 Non-Hodgkin lymphoma, unspecified, unspecified site: Secondary | ICD-10-CM | POA: Diagnosis not present

## 2021-02-15 DIAGNOSIS — E109 Type 1 diabetes mellitus without complications: Secondary | ICD-10-CM | POA: Diagnosis not present

## 2021-02-18 ENCOUNTER — Other Ambulatory Visit: Payer: Self-pay | Admitting: Endocrinology

## 2021-03-21 ENCOUNTER — Other Ambulatory Visit: Payer: Self-pay | Admitting: Endocrinology

## 2021-04-04 DIAGNOSIS — E109 Type 1 diabetes mellitus without complications: Secondary | ICD-10-CM | POA: Diagnosis not present

## 2021-04-04 DIAGNOSIS — Z794 Long term (current) use of insulin: Secondary | ICD-10-CM | POA: Diagnosis not present

## 2021-04-04 DIAGNOSIS — E1065 Type 1 diabetes mellitus with hyperglycemia: Secondary | ICD-10-CM | POA: Diagnosis not present

## 2021-05-03 DIAGNOSIS — R869 Unspecified abnormal finding in specimens from male genital organs: Secondary | ICD-10-CM | POA: Diagnosis not present

## 2021-05-03 DIAGNOSIS — C8590 Non-Hodgkin lymphoma, unspecified, unspecified site: Secondary | ICD-10-CM | POA: Diagnosis not present

## 2021-05-03 DIAGNOSIS — R350 Frequency of micturition: Secondary | ICD-10-CM | POA: Diagnosis not present

## 2021-05-03 DIAGNOSIS — R051 Acute cough: Secondary | ICD-10-CM | POA: Diagnosis not present

## 2021-05-22 ENCOUNTER — Other Ambulatory Visit (INDEPENDENT_AMBULATORY_CARE_PROVIDER_SITE_OTHER): Payer: BC Managed Care – PPO

## 2021-05-22 ENCOUNTER — Other Ambulatory Visit: Payer: Self-pay | Admitting: Endocrinology

## 2021-05-22 ENCOUNTER — Other Ambulatory Visit: Payer: Self-pay

## 2021-05-22 DIAGNOSIS — E1065 Type 1 diabetes mellitus with hyperglycemia: Secondary | ICD-10-CM

## 2021-05-22 DIAGNOSIS — E7800 Pure hypercholesterolemia, unspecified: Secondary | ICD-10-CM | POA: Diagnosis not present

## 2021-05-22 LAB — COMPREHENSIVE METABOLIC PANEL
ALT: 20 U/L (ref 0–53)
AST: 22 U/L (ref 0–37)
Albumin: 4.3 g/dL (ref 3.5–5.2)
Alkaline Phosphatase: 66 U/L (ref 39–117)
BUN: 14 mg/dL (ref 6–23)
CO2: 31 mEq/L (ref 19–32)
Calcium: 9.2 mg/dL (ref 8.4–10.5)
Chloride: 100 mEq/L (ref 96–112)
Creatinine, Ser: 0.97 mg/dL (ref 0.40–1.50)
GFR: 87.4 mL/min (ref >60.00)
Glucose, Bld: 113 mg/dL — ABNORMAL HIGH (ref 70–99)
Potassium: 4.8 mEq/L (ref 3.5–5.1)
Sodium: 136 mEq/L (ref 135–145)
Total Bilirubin: 0.9 mg/dL (ref 0.2–1.2)
Total Protein: 6.5 g/dL (ref 6.0–8.3)

## 2021-05-22 LAB — LIPID PANEL
Cholesterol: 168 mg/dL (ref 0–200)
HDL: 59.5 mg/dL (ref >39.00)
LDL Cholesterol: 99 mg/dL (ref 0–99)
NonHDL: 108.45
Total CHOL/HDL Ratio: 3
Triglycerides: 45 mg/dL (ref 0.0–149.0)
VLDL: 9 mg/dL (ref 0.0–40.0)

## 2021-05-22 LAB — URINALYSIS, ROUTINE W REFLEX MICROSCOPIC
Bilirubin Urine: NEGATIVE
Hgb urine dipstick: NEGATIVE
Ketones, ur: NEGATIVE
Leukocytes,Ua: NEGATIVE
Nitrite: NEGATIVE
RBC / HPF: NONE SEEN (ref 0–?)
Specific Gravity, Urine: 1.01 (ref 1.000–1.030)
Total Protein, Urine: NEGATIVE
Urine Glucose: NEGATIVE
Urobilinogen, UA: 0.2 (ref 0.0–1.0)
WBC, UA: NONE SEEN (ref 0–?)
pH: 7.5 (ref 5.0–8.0)

## 2021-05-22 LAB — HEMOGLOBIN A1C: Hgb A1c MFr Bld: 6.7 % — ABNORMAL HIGH (ref 4.6–6.5)

## 2021-05-24 DIAGNOSIS — E785 Hyperlipidemia, unspecified: Secondary | ICD-10-CM | POA: Diagnosis not present

## 2021-05-24 DIAGNOSIS — E109 Type 1 diabetes mellitus without complications: Secondary | ICD-10-CM | POA: Diagnosis not present

## 2021-05-24 DIAGNOSIS — Z125 Encounter for screening for malignant neoplasm of prostate: Secondary | ICD-10-CM | POA: Diagnosis not present

## 2021-05-25 ENCOUNTER — Ambulatory Visit (INDEPENDENT_AMBULATORY_CARE_PROVIDER_SITE_OTHER): Payer: BC Managed Care – PPO | Admitting: Endocrinology

## 2021-05-25 ENCOUNTER — Encounter: Payer: Self-pay | Admitting: Endocrinology

## 2021-05-25 ENCOUNTER — Other Ambulatory Visit: Payer: Self-pay

## 2021-05-25 VITALS — BP 122/82 | HR 86 | Ht 73.0 in | Wt 197.0 lb

## 2021-05-25 DIAGNOSIS — E1065 Type 1 diabetes mellitus with hyperglycemia: Secondary | ICD-10-CM

## 2021-05-25 DIAGNOSIS — E7800 Pure hypercholesterolemia, unspecified: Secondary | ICD-10-CM

## 2021-05-25 NOTE — Progress Notes (Signed)
Patient ID: Corey Hudson, male   DOB: [birth year], 56 y.o.   MRN: [id number]   Chief complaint: Type 1 diabetes followup:   CURRENT insulin pump brand:  Medtronic  670  Prior history: He has a long-standing type 1 diabetes which is usually well-controlled Previously A1c readings have been as low as 5.8 although was having more hypoglycemia in the past  HISTORY:   The pump SETTINGS are: Basal rate: 1.25, midnight-2:30 a.m.,2:30 a.m. = 0.90, 5 AM = 0.65 10 AM = 0.5, 7:30 PM = 0.75   Carb Ratio: 1:10 at breakfast, lunch 1: 12, 5-9 PM = 1: 14 and 9 PM = 1: 12  Correction factor 1:40 during the day and 1:50 at night with target 100 -110 and active insulin 3 hours   Current insulin is NovoLog   His A1c is  stable at 6.7   In the last 2 weeks he has been in the AUTO mode 85 % of the time  He has not been exercising since he had a respiratory infection about 2 to 3 weeks ago  Also since he was told that he had some proteinuria he started progressively bringing his blood sugar down causing more hypoglycemia  Most of his low blood sugars are in the afternoons and frequently this may be due to excessive bolusing or stacking boluses  Also at times when he has high blood sugar he increases his basal rate on his own temporarily up to about 4 units an hour However this frequently will cause his blood sugar to be low 2 or 3 hours later Overall has had more hypoglycemia compared to his last visit Also blood sugars above 180 are less frequent He has not upgraded to the 770 pump because he did not think he needed a cell phone display on the pump  Also because of his work he tends to break the clip on his pump which comes off  Overall postprandial readings are well controlled and usually not having excessive hyperglycemia However blood sugars overall are lower between 1 and 3 hours after lunch   His mealtimes usually 8-9 AM for breakfast, 12 PM-2 PM for lunch and usually 7 PM for  dinner  Download of his CGM was reviewed and interpreted as follows  Overall his level of control is similar to the last visit but has significantly more hypoglycemia with 6% of readings below 70 compared to 1%  HYPERGLYCEMIA is mostly around 8-10 PM  Some of this is rebound from low normal or low sugars  Overnight blood sugars are more variable in the early part of the night with occasional hyperglycemia and rarely low sugars but usually stable by 4-5 AM in the target range POSTPRANDIAL readings are very consistently controlled after breakfast with only minimal increase Lowest postprandial readings are after lunch with periodic low sugars between 1-3 hours later After dinner blood sugars are variable but appear to be rising before the bolus is effective frequently and may have hyperglycemia about half the time over 180 Hypoglycemia is mostly in the afternoons and lowest blood sugars around 6-7 PM  CGM use % of time 89  2-week average/GV 133+/-47  Time in range       79 %  % Time Above 180 12+3  % Time above 250   % Time Below 70 5+1     PRE-MEAL Fasting Lunch Dinner Bedtime Overall  Glucose range:       Averages: 142 131 133 145/137  POST-MEAL 2 hours PC Breakfast PC Lunch PC Dinner  Glucose range:     Averages: 151 131  157   Previously:   CGM use % of time 68  2-week average/SD 146+/-39  Time in range    82    %  % Time Above 180 16+1  % Time above 250   % Time Below 70 1     PRE-MEAL Fasting Lunch Dinner Bedtime Overall  Glucose range:       Averages: 147 139 152     POST-MEAL PC Breakfast PC Lunch PC Dinner  Glucose range:     Averages: 153 182 118     Hypoglycemia: With hypoglycemia he gets symptoms of confusion, feeling sleepy, anxious and at night will wake up with strange dreams and anxiety. Recently most symptoms are is related to anxiety Blood sugars may be in the 40s or 50s when he has a low sugar, currently not having a glucagon kit but had it in the  past   Wt Readings from Last 3 Encounters:  05/25/21 197 lb (89.4 kg)  12/20/20 200 lb (90.7 kg)  06/20/20 199 lb (90.3 kg)    Lab Results  Component Value Date   HGBA1C 6.7 (H) 05/22/2021   HGBA1C 6.7 (H) 12/15/2020   HGBA1C 6.6 (H) 06/07/2020   Lab Results  Component Value Date   MICROALBUR <0.7 12/15/2020   LDLCALC 99 05/22/2021   CREATININE 0.97 05/22/2021     Allergies as of 05/25/2021   No Known Allergies      Medication List        Accurate as of May 25, 2021  8:48 AM. If you have any questions, ask your nurse or doctor.          acetaminophen 325 MG tablet Commonly known as: TYLENOL Take 650 mg by mouth every 6 (six) hours as needed. Reported on 11/09/2015   Bayer Microlet Lancets lancets Use as instructed to check your blood sugar 10 times per day   Contour Next Test test strip Generic drug: glucose blood USE TO CHECK BLOOD SUGAR 10 TIMES DAILY AS DIRECTED   folic acid 1 MG tablet Commonly known as: FOLVITE Take 1 mg by mouth daily.   insulin aspart 100 UNIT/ML injection Commonly known as: novoLOG INJECT A MAX OF 85 UNITS DAILY INTO PUMP   insulin glargine 100 UNIT/ML injection Commonly known as: Lantus USE AS DIRECTED UP TO 55 UNITS DAILY   multivitamin tablet Take 1 tablet by mouth daily.   pravastatin 40 MG tablet Commonly known as: PRAVACHOL TAKE 1 TABLET BY MOUTH DAILY        Allergies: No Known Allergies  Past Medical History:  Diagnosis Date   Cancer ([place])    Diabetes mellitus     No past surgical history on file.  Family History  Problem Relation Age of Onset   Diabetes Paternal Aunt    Heart disease Paternal Grandfather     Social History:  reports that he has never smoked. He has never used smokeless tobacco. He reports that he does not drink alcohol. No history on file for drug use.  REVIEW of systems:   HYPERLIPIDEMIA: Baseline LDL has been below 100 Has been on pravastatin for cardiovascular  protection LDL has been consistently controlled   Lab Results  Component Value Date   CHOL 168 05/22/2021   HDL 59.50 05/22/2021   LDLCALC 99 05/22/2021   LDLDIRECT 100.0 12/18/2013   TRIG 45.0 05/22/2021  CHOLHDL 3 05/22/2021   He has no numbness or tingling in his feet, previously had symptoms that started after his chemotherapy several years ago   Diabetic foot in 2/22 was normal  No evidence of retinopathy on exams done annually  He is concerned that he had some protein in his urine when he had urinalysis done in 1/23, this is not present now No history of microalbuminuria   He has been reluctant to take the COVID-vaccine Also he is reluctant to take the influenza vaccine  Blood pressure has been close to normal usually  BP Readings from Last 3 Encounters:  05/25/21 122/82  12/20/20 128/86  06/20/20 126/82   He is asking about a cyst present for some time on his back, he gets some whitish secretion from this  EXAM:  BP 122/82    Pulse 86    Ht 6' 1"  (1.854 m)    Wt 197 lb (89.4 kg)    SpO2 93%    BMI 25.99 kg/m   He has about a 1.5 cm sebaceous cyst on the mid back without any signs of infection  ASSESSMENT:  Type 1 diabetes, on Medtronic 670 insulin pump, overall well controlled  See history of present illness for detailed discussion of current diabetes management, blood sugar patterns and problems identified Insulin pump management and CGM analysis was reviewed in detail    A1c is the same at 6.7  As above his recent management of his insulin boluses is tending to cause excessive hypoglycemia He has been trying to be overaggressive with his insulin management including using temporary basals up to 4 units/h as well as stacking boluses when blood sugars are high at different times Also with interrupting manually his auto mode blood sugars go out of control Occasionally will have blood sugars that are low overnight also from these actions Most of his  sugars in the afternoons and before suppertime seem to be low or low normal and likely lower when he is more active at work  Recently has 6% of his blood sugars below 70 but only 12% above 180   LIPIDS: well-controlled with pravastatin which he will need to take long-term  Recommendations: Carbohydrate coverage 1: 15 at lunchtime instead of 1: 12 Basal rate at midnight = 1.1 Avoid using high basal rate settings for making corrections and only use correction boluses if blood sugars are persistently high He needs to avoid bolusing for any postprandial spikes unless blood sugars are staying high  Temporary target to be started before leaving home for the gym when he starts back Try to use auto mode consistently and not switch to manual mode He will check with the Medtr[employer] representative about going up to the 770 upgrade Also discussed options of using the OmniPod 5 pump with the closed-loop system and having the Dexcom sensor as well as no need for tubing, patient brochure given and he was shown a sample of the OmniPod device  We will recheck microalbumin on the next visit  Follow-up in 4 months    There are no Patient Instructions on file for this visit.    Elayne Snare  Note: This office note was prepared with Estate agent. Any transcriptional errors that result from this process are unintentional.

## 2021-05-25 NOTE — Patient Instructions (Signed)
Bolus 10-15 min before dinner

## 2021-05-31 DIAGNOSIS — Z1212 Encounter for screening for malignant neoplasm of rectum: Secondary | ICD-10-CM | POA: Diagnosis not present

## 2021-05-31 DIAGNOSIS — R82998 Other abnormal findings in urine: Secondary | ICD-10-CM | POA: Diagnosis not present

## 2021-05-31 DIAGNOSIS — E109 Type 1 diabetes mellitus without complications: Secondary | ICD-10-CM | POA: Diagnosis not present

## 2021-05-31 DIAGNOSIS — Z1331 Encounter for screening for depression: Secondary | ICD-10-CM | POA: Diagnosis not present

## 2021-05-31 DIAGNOSIS — Z0000 Encounter for general adult medical examination without abnormal findings: Secondary | ICD-10-CM | POA: Diagnosis not present

## 2021-05-31 DIAGNOSIS — C8590 Non-Hodgkin lymphoma, unspecified, unspecified site: Secondary | ICD-10-CM | POA: Diagnosis not present

## 2021-06-01 ENCOUNTER — Encounter: Payer: Self-pay | Admitting: Endocrinology

## 2021-06-12 DIAGNOSIS — E109 Type 1 diabetes mellitus without complications: Secondary | ICD-10-CM | POA: Diagnosis not present

## 2021-06-12 DIAGNOSIS — Z794 Long term (current) use of insulin: Secondary | ICD-10-CM | POA: Diagnosis not present

## 2021-06-12 DIAGNOSIS — E1065 Type 1 diabetes mellitus with hyperglycemia: Secondary | ICD-10-CM | POA: Diagnosis not present

## 2021-07-10 ENCOUNTER — Other Ambulatory Visit: Payer: Self-pay | Admitting: Endocrinology

## 2021-07-18 ENCOUNTER — Other Ambulatory Visit: Payer: Self-pay | Admitting: Endocrinology

## 2021-07-19 ENCOUNTER — Other Ambulatory Visit: Payer: Self-pay

## 2021-07-19 MED ORDER — PRAVASTATIN SODIUM 40 MG PO TABS: 40.0000 mg | ORAL_TABLET | Freq: Every day | ORAL | 3 refills | Status: DC

## 2021-07-20 DIAGNOSIS — E1065 Type 1 diabetes mellitus with hyperglycemia: Secondary | ICD-10-CM | POA: Diagnosis not present

## 2021-07-20 DIAGNOSIS — Z794 Long term (current) use of insulin: Secondary | ICD-10-CM | POA: Diagnosis not present

## 2021-07-20 DIAGNOSIS — E109 Type 1 diabetes mellitus without complications: Secondary | ICD-10-CM | POA: Diagnosis not present

## 2021-08-29 DIAGNOSIS — E109 Type 1 diabetes mellitus without complications: Secondary | ICD-10-CM | POA: Diagnosis not present

## 2021-08-29 DIAGNOSIS — Z794 Long term (current) use of insulin: Secondary | ICD-10-CM | POA: Diagnosis not present

## 2021-08-29 DIAGNOSIS — E1065 Type 1 diabetes mellitus with hyperglycemia: Secondary | ICD-10-CM | POA: Diagnosis not present

## 2021-08-31 DIAGNOSIS — Z794 Long term (current) use of insulin: Secondary | ICD-10-CM | POA: Diagnosis not present

## 2021-08-31 DIAGNOSIS — E1065 Type 1 diabetes mellitus with hyperglycemia: Secondary | ICD-10-CM | POA: Diagnosis not present

## 2021-08-31 DIAGNOSIS — E109 Type 1 diabetes mellitus without complications: Secondary | ICD-10-CM | POA: Diagnosis not present

## 2021-10-16 DIAGNOSIS — E1065 Type 1 diabetes mellitus with hyperglycemia: Secondary | ICD-10-CM | POA: Diagnosis not present

## 2021-10-16 DIAGNOSIS — E109 Type 1 diabetes mellitus without complications: Secondary | ICD-10-CM | POA: Diagnosis not present

## 2021-10-16 DIAGNOSIS — Z794 Long term (current) use of insulin: Secondary | ICD-10-CM | POA: Diagnosis not present

## 2021-10-18 DIAGNOSIS — E109 Type 1 diabetes mellitus without complications: Secondary | ICD-10-CM | POA: Diagnosis not present

## 2021-10-30 ENCOUNTER — Other Ambulatory Visit: Payer: BC Managed Care – PPO

## 2021-11-02 ENCOUNTER — Ambulatory Visit: Payer: BC Managed Care – PPO | Admitting: Endocrinology

## 2021-11-15 ENCOUNTER — Other Ambulatory Visit: Payer: Self-pay

## 2021-11-15 DIAGNOSIS — E1065 Type 1 diabetes mellitus with hyperglycemia: Secondary | ICD-10-CM

## 2021-11-15 MED ORDER — PRAVASTATIN SODIUM 40 MG PO TABS: 40.0000 mg | ORAL_TABLET | Freq: Every day | ORAL | 0 refills | Status: DC

## 2021-11-22 DIAGNOSIS — H5203 Hypermetropia, bilateral: Secondary | ICD-10-CM | POA: Diagnosis not present

## 2021-11-29 DIAGNOSIS — Z794 Long term (current) use of insulin: Secondary | ICD-10-CM | POA: Diagnosis not present

## 2021-11-29 DIAGNOSIS — E1065 Type 1 diabetes mellitus with hyperglycemia: Secondary | ICD-10-CM | POA: Diagnosis not present

## 2021-11-29 DIAGNOSIS — E109 Type 1 diabetes mellitus without complications: Secondary | ICD-10-CM | POA: Diagnosis not present

## 2021-12-30 DIAGNOSIS — E1065 Type 1 diabetes mellitus with hyperglycemia: Secondary | ICD-10-CM | POA: Diagnosis not present

## 2021-12-30 DIAGNOSIS — E109 Type 1 diabetes mellitus without complications: Secondary | ICD-10-CM | POA: Diagnosis not present

## 2021-12-30 DIAGNOSIS — Z794 Long term (current) use of insulin: Secondary | ICD-10-CM | POA: Diagnosis not present

## 2022-01-03 ENCOUNTER — Other Ambulatory Visit: Payer: BC Managed Care – PPO

## 2022-01-03 ENCOUNTER — Other Ambulatory Visit (INDEPENDENT_AMBULATORY_CARE_PROVIDER_SITE_OTHER): Payer: BC Managed Care – PPO

## 2022-01-03 DIAGNOSIS — E1065 Type 1 diabetes mellitus with hyperglycemia: Secondary | ICD-10-CM

## 2022-01-03 LAB — HEMOGLOBIN A1C: Hgb A1c MFr Bld: 6.8 % — ABNORMAL HIGH (ref 4.6–6.5)

## 2022-01-03 LAB — MICROALBUMIN / CREATININE URINE RATIO
Creatinine,U: 112.2 mg/dL
Microalb Creat Ratio: 0.6 mg/g (ref 0.0–30.0)
Microalb, Ur: <0.7 mg/dL (ref 0.0–1.9)

## 2022-01-03 LAB — GLUCOSE, RANDOM: Glucose, Bld: 118 mg/dL — ABNORMAL HIGH (ref 70–99)

## 2022-01-05 ENCOUNTER — Encounter: Payer: BC Managed Care – PPO | Admitting: Endocrinology

## 2022-01-05 NOTE — Progress Notes (Unsigned)
7

## 2022-01-06 NOTE — Progress Notes (Signed)
This encounter was created in error - please disregard.

## 2022-01-11 ENCOUNTER — Other Ambulatory Visit: Payer: Self-pay | Admitting: Endocrinology

## 2022-01-11 DIAGNOSIS — E1065 Type 1 diabetes mellitus with hyperglycemia: Secondary | ICD-10-CM

## 2022-01-17 ENCOUNTER — Ambulatory Visit (INDEPENDENT_AMBULATORY_CARE_PROVIDER_SITE_OTHER): Payer: BC Managed Care – PPO | Admitting: Endocrinology

## 2022-01-17 ENCOUNTER — Encounter: Payer: Self-pay | Admitting: Endocrinology

## 2022-01-17 VITALS — BP 110/60 | HR 78 | Ht 73.0 in | Wt 196.4 lb

## 2022-01-17 DIAGNOSIS — E1065 Type 1 diabetes mellitus with hyperglycemia: Secondary | ICD-10-CM | POA: Diagnosis not present

## 2022-01-17 DIAGNOSIS — E7800 Pure hypercholesterolemia, unspecified: Secondary | ICD-10-CM | POA: Diagnosis not present

## 2022-01-17 DIAGNOSIS — Z794 Long term (current) use of insulin: Secondary | ICD-10-CM | POA: Diagnosis not present

## 2022-01-17 DIAGNOSIS — E109 Type 1 diabetes mellitus without complications: Secondary | ICD-10-CM | POA: Diagnosis not present

## 2022-01-17 LAB — POCT GLUCOSE (DEVICE FOR HOME USE): Glucose Fasting, POC: 119 mg/dL — AB (ref 70–99)

## 2022-01-17 NOTE — Progress Notes (Signed)
Patient ID: Corey Hudson, male   DOB: 15-Nov-1965, 56 y.o.   MRN: [id number]   Chief complaint: Type 1 diabetes followup:   CURRENT insulin pump brand:  Medtronic  670  Prior history: He has a long-standing type 1 diabetes which is usually well-controlled Previously A1c readings have been as low as 5.8 although was having more hypoglycemia in the past  HISTORY:   The pump SETTINGS are: Basal rate: 1.25, midnight-2:30 a.m.,2:30 a.m. = 0.90, 5 AM = 0.65 10 AM = 0.5, 7:30 PM = 0.75   Carb Ratio: 1:10 at breakfast, lunch 1: 12, 5-9 PM = 1: 14 and 9 PM = 1: 12  Correction factor 1:40 during the day and 1:50 at night with target 100 -110 and active insulin 3 hours   Current insulin is NovoLog   His A1c is  stable at 6.8  In the last 2 weeks he has been in the AUTO mode 85 % of the time  He has not been able to upgrade to the 780 pump as yet because of compatibility with his phone but is planning to do so now  However still has level of control is excellent with 75% within target range, slightly less than before  He says that because of long working hours and stress he has had difficulty controlling his sugars especially when working late at night Appears to have highest readings overnight especially this past weekend where he was not able to bring his sugars down even with repeated boluses  However usually blood sugars after meals during the day are well controlled Also hypoglycemia the minimal overall compared to previous visit  Has been intermittently exercising and is able to adjust his pump or disconnected as needed based on intensity of exercise He was asked to change his carb ratio to 115 at lunch but he still has the same settings He again tries to sometimes get into the manual mode because he can raise his basal rate manually to try and control his blood sugars   His mealtimes usually 8-9 AM for breakfast, 12 PM-2 PM for lunch and usually 7 PM for dinner  Download  of his CGM was reviewed and interpreted as follows  Blood sugars are much more variable and higher overnight compared to last visit between about midnight-6 AM  Otherwise blood sugars are generally very stable between 7 AM and 2 PM Again he has more variability in his blood sugars after meals especially after dinner No consistent pattern seen after meals Overall on an average blood sugars are not rising excessively after meals but she will still have occasional spikes  Hypoglycemia is mostly in the late and occasionally related to boluses at lunch or dinner   CGM use % of time 89  2-week average/GV 149/33  Time in range      75%  % Time Above 180 19  % Time above 250 4  % Time Below 70 2     PRE-MEAL Fasting Lunch Dinner Bedtime Overall  Glucose range:       Averages: 134 137 148 193/191    POST-MEAL PC Breakfast PC Lunch PC Dinner  Glucose range:     Averages: 126 163 139   Previously   CGM use % of time 89  2-week average/GV 133+/-47  Time in range       79 %  % Time Above 180 12+3  % Time above 250   % Time Below 70 5+1  PRE-MEAL Fasting Lunch Dinner Bedtime Overall  Glucose range:       Averages: 142 131 133 145/137    POST-MEAL 2 hours PC Breakfast PC Lunch PC Dinner  Glucose range:     Averages: 151 131  157   Previously:   CGM use % of time 68  2-week average/SD 146+/-39  Time in range    82    %  % Time Above 180 16+1  % Time above 250   % Time Below 70 1     PRE-MEAL Fasting Lunch Dinner Bedtime Overall  Glucose range:       Averages: 147 139 152     POST-MEAL PC Breakfast PC Lunch PC Dinner  Glucose range:     Averages: 153 182 118     Hypoglycemia: With hypoglycemia he gets symptoms of confusion, feeling sleepy, anxious and at night will wake up with strange dreams and anxiety. Recently most symptoms are is related to anxiety Blood sugars may be in the 40s or 50s when he has a low sugar, currently not having a glucagon kit but had it in  the past   Wt Readings from Last 3 Encounters:  01/17/22 196 lb 6.4 oz (89.1 kg)  05/25/21 197 lb (89.4 kg)  12/20/20 200 lb (90.7 kg)    Lab Results  Component Value Date   HGBA1C 6.8 (H) 01/03/2022   HGBA1C 6.7 (H) 05/22/2021   HGBA1C 6.7 (H) 12/15/2020   Lab Results  Component Value Date   MICROALBUR <0.7 01/03/2022   LDLCALC 99 05/22/2021   CREATININE 0.97 05/22/2021     Allergies as of 01/17/2022   No Known Allergies      Medication List        Accurate as of January 17, 2022 10:18 AM. If you have any questions, ask your nurse or doctor.          acetaminophen 325 MG tablet Commonly known as: TYLENOL Take 650 mg by mouth every 6 (six) hours as needed. Reported on 11/09/2015   Bayer Microlet Lancets lancets Use as instructed to check your blood sugar 10 times per day   Contour Next Test test strip Generic drug: glucose blood USE TO CHECK BLOOD SUGAR 10 TIMES DAILY AS DIRECTED   folic acid 1 MG tablet Commonly known as: FOLVITE Take 1 mg by mouth daily.   insulin aspart 100 UNIT/ML injection Commonly known as: novoLOG INJECT A MAX OF 85 UNITS DAILY INTO PUMP   insulin glargine 100 UNIT/ML injection Commonly known as: Lantus USE AS DIRECTED UP TO 55 UNITS DAILY   multivitamin tablet Take 1 tablet by mouth daily.   pravastatin 40 MG tablet Commonly known as: PRAVACHOL TAKE 1 TABLET BY MOUTH DAILY        Allergies: No Known Allergies  Past Medical History:  Diagnosis Date   Cancer ([place])    Diabetes mellitus     No past surgical history on file.  Family History  Problem Relation Age of Onset   Diabetes Paternal Aunt    Heart disease Paternal Grandfather     Social History:  reports that he has never smoked. He has never used smokeless tobacco. He reports that he does not drink alcohol. No history on file for drug use.  REVIEW of systems:   HYPERLIPIDEMIA: Baseline LDL has been below 100 Has been on pravastatin for  cardiovascular protection LDL has been consistently controlled   Lab Results  Component Value Date   CHOL  168 05/22/2021   HDL 59.50 05/22/2021   LDLCALC 99 05/22/2021   LDLDIRECT 100.0 12/18/2013   TRIG 45.0 05/22/2021   CHOLHDL 3 05/22/2021   He has no numbness or tingling in his feet, previously had symptoms that started after his chemotherapy several years ago   Diabetic foot in 2/22 was normal  No evidence of retinopathy on exams done annually   No history of microalbuminuria   He has been reluctant to take the COVID-vaccine Also he is reluctant to take the influenza vaccine  Blood pressure has been high usually  BP Readings from Last 3 Encounters:  01/17/22 110/60  05/25/21 122/82  12/20/20 128/86     EXAM:  BP 110/60 (BP Location: Left Arm, Patient Position: Sitting, Cuff Size: Normal)   Pulse 78   Ht 6' 1"  (1.854 m)   Wt 196 lb 6.4 oz (89.1 kg)   SpO2 97%   BMI 25.91 kg/m   He has about a 1.5 cm sebaceous cyst on the mid back without any signs of infection  ASSESSMENT:  Type 1 diabetes, on Medtronic 670 insulin pump, overall well controlled  See history of present illness for detailed discussion of current diabetes management, blood sugar patterns and problems identified Insulin pump management and CGM analysis was reviewed in detail    A1c is the same at 6.8  As above his recent management of his insulin boluses is tending to cause excessive hypoglycemia He has been trying to be overaggressive with his insulin management including using temporary basals up to 4 units/h as well as stacking boluses when blood sugars are high at different times Also with interrupting manually his auto mode blood sugars go out of control Occasionally will have blood sugars that are low overnight also from these actions Most of his sugars in the afternoons and before suppertime seem to be low or low normal and likely lower when he is more active at work  Recently has  6% of his blood sugars below 70 but only 12% above 180   LIPIDS: Will recheck on the next visit  Recommendations: No change in settings at the moment Discussed that he will likely have better control of his blood sugars with the 780 pump and he will hopefully can upgrade This will also allow him to more consistently controlled blood sugars after meals or for unexpected hyperglycemic episodes Continue to bolus ahead of time for most meals and add extra for any high fat  Follow-up in 4 months   Total visit time for evaluation and management and counseling = 30 minutes  There are no Patient Instructions on file for this visit.    Elayne Snare  Note: This office note was prepared with Estate agent. Any transcriptional errors that result from this process are unintentional.

## 2022-01-26 ENCOUNTER — Encounter: Payer: Self-pay | Admitting: Endocrinology

## 2022-02-07 DIAGNOSIS — E1065 Type 1 diabetes mellitus with hyperglycemia: Secondary | ICD-10-CM | POA: Diagnosis not present

## 2022-02-07 DIAGNOSIS — E109 Type 1 diabetes mellitus without complications: Secondary | ICD-10-CM | POA: Diagnosis not present

## 2022-02-07 DIAGNOSIS — Z794 Long term (current) use of insulin: Secondary | ICD-10-CM | POA: Diagnosis not present

## 2022-02-19 DIAGNOSIS — E109 Type 1 diabetes mellitus without complications: Secondary | ICD-10-CM | POA: Diagnosis not present

## 2022-03-01 ENCOUNTER — Other Ambulatory Visit: Payer: Self-pay | Admitting: Endocrinology

## 2022-03-01 DIAGNOSIS — Z794 Long term (current) use of insulin: Secondary | ICD-10-CM | POA: Diagnosis not present

## 2022-03-01 DIAGNOSIS — E1065 Type 1 diabetes mellitus with hyperglycemia: Secondary | ICD-10-CM

## 2022-03-06 DIAGNOSIS — C44519 Basal cell carcinoma of skin of other part of trunk: Secondary | ICD-10-CM | POA: Diagnosis not present

## 2022-03-06 DIAGNOSIS — Z1283 Encounter for screening for malignant neoplasm of skin: Secondary | ICD-10-CM | POA: Diagnosis not present

## 2022-03-06 DIAGNOSIS — D225 Melanocytic nevi of trunk: Secondary | ICD-10-CM | POA: Diagnosis not present

## 2022-03-06 DIAGNOSIS — D0339 Melanoma in situ of other parts of face: Secondary | ICD-10-CM | POA: Diagnosis not present

## 2022-03-20 DIAGNOSIS — L570 Actinic keratosis: Secondary | ICD-10-CM | POA: Diagnosis not present

## 2022-03-20 DIAGNOSIS — X32XXXD Exposure to sunlight, subsequent encounter: Secondary | ICD-10-CM | POA: Diagnosis not present

## 2022-03-20 DIAGNOSIS — Z85828 Personal history of other malignant neoplasm of skin: Secondary | ICD-10-CM | POA: Diagnosis not present

## 2022-03-20 DIAGNOSIS — Z08 Encounter for follow-up examination after completed treatment for malignant neoplasm: Secondary | ICD-10-CM | POA: Diagnosis not present

## 2022-03-20 DIAGNOSIS — Z85820 Personal history of malignant melanoma of skin: Secondary | ICD-10-CM | POA: Diagnosis not present

## 2022-04-24 DIAGNOSIS — D0339 Melanoma in situ of other parts of face: Secondary | ICD-10-CM | POA: Diagnosis not present

## 2022-04-24 DIAGNOSIS — L905 Scar conditions and fibrosis of skin: Secondary | ICD-10-CM | POA: Diagnosis not present

## 2022-04-24 DIAGNOSIS — L989 Disorder of the skin and subcutaneous tissue, unspecified: Secondary | ICD-10-CM | POA: Diagnosis not present

## 2022-04-30 DIAGNOSIS — Z794 Long term (current) use of insulin: Secondary | ICD-10-CM | POA: Diagnosis not present

## 2022-05-01 ENCOUNTER — Other Ambulatory Visit: Payer: Self-pay

## 2022-05-01 DIAGNOSIS — E1065 Type 1 diabetes mellitus with hyperglycemia: Secondary | ICD-10-CM

## 2022-05-01 MED ORDER — ACCU-CHEK GUIDE VI STRP: ORAL_STRIP | 2 refills | Status: DC

## 2022-05-02 ENCOUNTER — Ambulatory Visit: Payer: BC Managed Care – PPO | Admitting: Endocrinology

## 2022-05-30 DIAGNOSIS — E109 Type 1 diabetes mellitus without complications: Secondary | ICD-10-CM | POA: Diagnosis not present

## 2022-05-30 DIAGNOSIS — E785 Hyperlipidemia, unspecified: Secondary | ICD-10-CM | POA: Diagnosis not present

## 2022-05-30 DIAGNOSIS — Z125 Encounter for screening for malignant neoplasm of prostate: Secondary | ICD-10-CM | POA: Diagnosis not present

## 2022-06-01 DIAGNOSIS — Z794 Long term (current) use of insulin: Secondary | ICD-10-CM | POA: Diagnosis not present

## 2022-06-11 DIAGNOSIS — E663 Overweight: Secondary | ICD-10-CM | POA: Diagnosis not present

## 2022-06-11 DIAGNOSIS — E109 Type 1 diabetes mellitus without complications: Secondary | ICD-10-CM | POA: Diagnosis not present

## 2022-06-11 DIAGNOSIS — Z1331 Encounter for screening for depression: Secondary | ICD-10-CM | POA: Diagnosis not present

## 2022-06-11 DIAGNOSIS — Z0000 Encounter for general adult medical examination without abnormal findings: Secondary | ICD-10-CM | POA: Diagnosis not present

## 2022-06-11 DIAGNOSIS — Z794 Long term (current) use of insulin: Secondary | ICD-10-CM | POA: Diagnosis not present

## 2022-06-11 DIAGNOSIS — E785 Hyperlipidemia, unspecified: Secondary | ICD-10-CM | POA: Diagnosis not present

## 2022-07-12 DIAGNOSIS — Z794 Long term (current) use of insulin: Secondary | ICD-10-CM | POA: Diagnosis not present

## 2022-07-18 DIAGNOSIS — E109 Type 1 diabetes mellitus without complications: Secondary | ICD-10-CM | POA: Diagnosis not present

## 2022-07-18 DIAGNOSIS — S99921A Unspecified injury of right foot, initial encounter: Secondary | ICD-10-CM | POA: Diagnosis not present

## 2022-07-18 DIAGNOSIS — M79671 Pain in right foot: Secondary | ICD-10-CM | POA: Diagnosis not present

## 2022-08-15 ENCOUNTER — Telehealth: Payer: Self-pay

## 2022-08-15 NOTE — Telephone Encounter (Signed)
PA request received via CMM for Accu-Chek Guide strips  PA has been submitted to [facility] and is pending determination  Key: [id number]

## 2022-08-21 NOTE — Telephone Encounter (Signed)
Insurance is requesting additional info:    It looks like he was previously using Bayer/ Contour Next test strips. Is there a reason for the change to Accu-Chek?

## 2022-08-24 NOTE — Telephone Encounter (Signed)
Spoke with patient to attempt lab and 30 minute office visit - he will check with his work about his schedule and will call office week of 08/27/22 to schedule

## 2022-08-28 NOTE — Telephone Encounter (Signed)
Pharmacy Patient Advocate Encounter  Prior Authorization for Accu-Check test strips has been approved   Effective dates: 08/15/22 through 08/15/23

## 2022-09-01 ENCOUNTER — Other Ambulatory Visit: Payer: Self-pay | Admitting: Endocrinology

## 2022-09-01 DIAGNOSIS — E1065 Type 1 diabetes mellitus with hyperglycemia: Secondary | ICD-10-CM

## 2022-09-10 ENCOUNTER — Telehealth: Payer: Self-pay | Admitting: Endocrinology

## 2022-09-10 DIAGNOSIS — E1065 Type 1 diabetes mellitus with hyperglycemia: Secondary | ICD-10-CM

## 2022-09-10 NOTE — Telephone Encounter (Signed)
Patient called back and left a voicemail, stating his new insurance will cover: Lyumjev or Humalog Lispro. Which one do you prefer?

## 2022-09-10 NOTE — Telephone Encounter (Signed)
Patient called to advise that his insurance changed to [place] and that Rosann Auerbach does not cover his inulin.  Patient is contacting his insurance company to find out which insulin is covered and will call us back to advise.

## 2022-09-10 NOTE — Telephone Encounter (Signed)
Since it has been over an hour I called the patient back to see if he had any information and he voiced to me that he was getting ready to call them in a few minutes and call us back.

## 2022-09-11 ENCOUNTER — Telehealth: Payer: Self-pay | Admitting: Nutrition

## 2022-09-11 DIAGNOSIS — E1065 Type 1 diabetes mellitus with hyperglycemia: Secondary | ICD-10-CM

## 2022-09-11 MED ORDER — ONETOUCH VERIO W/DEVICE KIT: PACK | 0 refills | Status: DC

## 2022-09-11 MED ORDER — ONETOUCH DELICA LANCETS 33G MISC: 0 refills | Status: DC

## 2022-09-11 MED ORDER — GLUCOSE BLOOD VI STRP: ORAL_STRIP | 0 refills | Status: DC

## 2022-09-11 MED ORDER — LYUMJEV 100 UNIT/ML IJ SOLN: INTRAMUSCULAR | 0 refills | Status: DC

## 2022-09-11 NOTE — Telephone Encounter (Signed)
Requested Prescriptions   Signed Prescriptions Disp Refills   Insulin Lispro-aabc (LYUMJEV) 100 UNIT/ML SOLN 60 mL 0    Sig: INJECT A MAX OF 85 UNITS DAILY INTO PUMP    Authorizing Provider: Reather Littler    Ordering User: Valorie Roosevelt S   Please get patient scheduled, medication has been sent.

## 2022-09-11 NOTE — Telephone Encounter (Signed)
Requested Prescriptions   Signed Prescriptions Disp Refills   glucose blood test strip 300 each 0    Sig: Check blood sugar 8 times a day    Authorizing Provider: Reather Littler    Ordering User: Hyacinth Meeker, Jhair Witherington S   Blood Glucose Monitoring Suppl (ONETOUCH VERIO) w/Device KIT 1 kit 0    Sig: Check blood sugar 8 times a day    Authorizing Provider: Reather Littler    Ordering User: Hyacinth Meeker, Antonio Creswell S   OneTouch Delica Lancets 33G MISC 300 each 0    Sig: Check blood sugar 8 times a day    Authorizing Provider: Reather Littler    Ordering User: Pollie Meyer

## 2022-09-11 NOTE — Telephone Encounter (Signed)
Patient is out of insulin today.  Please order the Lyumjev insulin and also One touch verio test strips to [facility].

## 2022-09-11 NOTE — Addendum Note (Signed)
Addended by: Pollie Meyer on: 09/11/2022 10:22 AM   Modules accepted: Orders

## 2022-09-18 ENCOUNTER — Telehealth: Payer: Self-pay

## 2022-09-18 NOTE — Telephone Encounter (Signed)
Pt needs a PA for [employer], can someone assist with this?

## 2022-09-20 ENCOUNTER — Other Ambulatory Visit (HOSPITAL_COMMUNITY): Payer: Self-pay

## 2022-09-20 NOTE — Telephone Encounter (Signed)
Per test claim:    There is already a PA on file until 10/15/22  PA not submitted at this time

## 2022-09-20 NOTE — Telephone Encounter (Signed)
I called and left a detailed message with Corey Hudson at [place] and also the patient has been notified;

## 2022-09-21 ENCOUNTER — Telehealth: Payer: Self-pay | Admitting: Dietician

## 2022-09-21 NOTE — Telephone Encounter (Signed)
Please see telephone message from 09/18/22

## 2022-09-21 NOTE — Telephone Encounter (Signed)
Patient called and stated that he is having problems getting the Guardian 4 Sensors for his Medtronic 780G insulin pump.  Insurance is stating that they are not covered.  He has had a recent insurance changed to [place] secondary to his employee changing insurance.  780G does not work in the automated mode without the [employer].  He has discussed with Felisa Bonier that he is willing to pay out of pocket but has been getting mixed communication.  They have stated the sensors are covered then not covered on a separate occasion.  Chart reviewed.  It appears that he has a prior authorization for the Guardian 3 and likely needs a prior authorization for the Guardian 4 (ref#[id number]).  Will message the Medical Assistant to assist.  Soyla Murphy at [place] who is in charge of his problem at [phone] (Case# [id number]) and left a message.  Oran Rein, RD, LDN, CDCES

## 2022-09-24 NOTE — Telephone Encounter (Signed)
Please initiate a PA for Guardian 4 Sensors

## 2022-09-25 ENCOUNTER — Other Ambulatory Visit (HOSPITAL_COMMUNITY): Payer: Self-pay

## 2022-09-26 NOTE — Telephone Encounter (Signed)
Order has been Submitted though [place] and they will run this under his Medical benefit.   DME: Felisa Bonier

## 2022-09-26 NOTE — Telephone Encounter (Signed)
Order has been sent through [facility] to [facility].  Pt has been notified of this as well.

## 2022-09-27 ENCOUNTER — Telehealth: Payer: Self-pay | Admitting: Dietician

## 2022-09-27 DIAGNOSIS — E1065 Type 1 diabetes mellitus with hyperglycemia: Secondary | ICD-10-CM

## 2022-09-28 NOTE — Telephone Encounter (Signed)
Returned patient call. Patient has continued to have difficulty obtaining his Guardian 4 Sensors.  DME:  [facility] and spoke to 2 employees at [employer].  Insurance verification billing code [phone].  The last employee stated that they have one more step before this prescription can be filled and will inform patient of this.   They state that they have all of the paperwork required.  Felisa Bonier stated that they should know more information about this today. Discussed with medical assistants in the office as well.  Also, patient is concerned that he will not have adequate amounts of insulin to last until his next appointment with Dr. Lucianne Muss on 10/31/22. He states that he works in a very hot environment and his insulin goes bad more quickly in his pump requiring a greater need.  Will message MA to send another prescription.  Oran Rein, RD, LDN, CDCES

## 2022-10-01 ENCOUNTER — Other Ambulatory Visit: Payer: Self-pay | Admitting: Endocrinology

## 2022-10-01 DIAGNOSIS — E1065 Type 1 diabetes mellitus with hyperglycemia: Secondary | ICD-10-CM

## 2022-10-01 MED ORDER — LYUMJEV 100 UNIT/ML IJ SOLN
INTRAMUSCULAR | 0 refills | Status: DC
Start: 2022-10-01 — End: 2022-10-03

## 2022-10-01 NOTE — Telephone Encounter (Signed)
Refill sent.

## 2022-10-02 ENCOUNTER — Telehealth: Payer: Self-pay | Admitting: Nutrition

## 2022-10-02 DIAGNOSIS — E1065 Type 1 diabetes mellitus with hyperglycemia: Secondary | ICD-10-CM

## 2022-10-02 NOTE — Telephone Encounter (Signed)
Patient is saying his insulin does not last him more than 20 days.  Please send a new script for the Lyumjev insulin to say 66u/day via pump. Also please start a  prior auth for his Gardian 4 sensors and transmitter.  He has been out of them for one month.  Thank you

## 2022-10-03 MED ORDER — LYUMJEV 100 UNIT/ML IJ SOLN
INTRAMUSCULAR | 1 refills | Status: DC
Start: 2022-10-03 — End: 2022-11-23

## 2022-10-03 MED ORDER — GUARDIAN 4 TRANSMITTER MISC: 1.0000 | 1 refills | Status: AC

## 2022-10-03 MED ORDER — GUARDIAN 4 GLUCOSE SENSOR MISC: 1.0000 | 3 refills | Status: AC

## 2022-10-03 NOTE — Telephone Encounter (Signed)
Gardian 4 has been ordered.  This has been sent to [facility] and they have tried to contact patient.

## 2022-10-03 NOTE — Addendum Note (Signed)
Addended by: Judd Gaudier on: 10/03/2022 10:22 AM   Modules accepted: Orders

## 2022-10-03 NOTE — Telephone Encounter (Signed)
I called and spoke with Corrie Dandy at [place]. I advised to her that an order was placed through [facility]. She states that they did receive the order and she has resubmitted it and they will initiate the PA for the Guardian 4 Sensors and will also send text messages to the patient to keep him up to date as well.

## 2022-10-03 NOTE — Telephone Encounter (Signed)
See Other messages in pts chart.

## 2022-10-15 ENCOUNTER — Telehealth: Payer: Self-pay

## 2022-10-15 NOTE — Telephone Encounter (Signed)
Patient called in regards to the PA for his Guardian 4 sensors. [employer] PA team was contacted They stated that they received the PA 10/15/23 and that there was nothing else needed for the team to make a decision which would be 5 business days

## 2022-10-16 ENCOUNTER — Other Ambulatory Visit: Payer: BC Managed Care – PPO

## 2022-10-17 LAB — HEMOGLOBIN A1C: Hemoglobin A1C: 6.3

## 2022-10-23 NOTE — Telephone Encounter (Signed)
Pharmacy Patient Advocate Encounter  Prior Authorization for Guardian 4 Glucose sensors has been APPROVED by [employer] from 10/15/22 to 10/15/23.

## 2022-10-29 ENCOUNTER — Other Ambulatory Visit: Payer: Self-pay | Admitting: Endocrinology

## 2022-10-29 DIAGNOSIS — E1065 Type 1 diabetes mellitus with hyperglycemia: Secondary | ICD-10-CM

## 2022-10-31 ENCOUNTER — Other Ambulatory Visit: Payer: Managed Care, Other (non HMO)

## 2022-10-31 ENCOUNTER — Ambulatory Visit: Payer: Managed Care, Other (non HMO) | Admitting: Endocrinology

## 2022-10-31 ENCOUNTER — Encounter: Payer: Self-pay | Admitting: Endocrinology

## 2022-10-31 VITALS — BP 117/72 | HR 65 | Ht 73.75 in | Wt 200.0 lb

## 2022-10-31 DIAGNOSIS — E7800 Pure hypercholesterolemia, unspecified: Secondary | ICD-10-CM

## 2022-10-31 DIAGNOSIS — E1065 Type 1 diabetes mellitus with hyperglycemia: Secondary | ICD-10-CM

## 2022-10-31 LAB — MICROALBUMIN / CREATININE URINE RATIO
Creatinine,U: 118.5 mg/dL
Microalb Creat Ratio: 0.6 mg/g (ref 0.0–30.0)
Microalb, Ur: <0.7 mg/dL (ref 0.0–1.9)

## 2022-10-31 MED ORDER — PRAVASTATIN SODIUM 40 MG PO TABS: 40.0000 mg | ORAL_TABLET | Freq: Every day | ORAL | 1 refills | Status: DC

## 2022-10-31 NOTE — Progress Notes (Signed)
Patient ID: Corey Hudson, male   DOB: [birth year], 57 y.o.   MRN: [id number]   Chief complaint: Type 1 diabetes followup:   CURRENT insulin pump brand:  Medtronic 780  Prior history: He has a long-standing type 1 diabetes which is usually well-controlled Previously A1c readings have been as low as 5.8 although was having more hypoglycemia in the past  HISTORY:   The pump SETTINGS are: Basal rate: 1.25, midnight-2:30 a.m.,2:30 a.m. = 0.90, 5 AM = 0.65 10 AM = 0.5, 7:30 PM = 0.75   Carb Ratio: 1:10 at breakfast, lunch 1: 12, 5-9 PM = 1: 14 and 9 PM = 1: 12  Correction factor 1:40 during the day and 1:50 at night with target 100 -110 and active insulin 3 hours   Current insulin is NovoLog   His A1c is recently 6.3, previously was at 6.8 on his last visit in 9/23   He has been able to upgrade to the 780 pump although had difficulty getting the sensor approved Also now taking Lyumjev which is covered instead of NovoLog He is usually bolusing right before he starts eating However sometimes this bolus appears to be a little late and blood sugars are rising If he is more active especially after breakfast he may have tendency to relatively low sugars Usually not using the temporary target unless he is doing exercise like hiking However overall frequency and duration of hypoglycemia is not excessive but similar to the last visit He is liking the smart guard and using it most of the time He did not change the active insulin time with the Occasionally with relatively higher fat meals his blood sugar will go up excessively again bloating when eating out despite extra boluses Time in range is only slightly better compared to last time   His mealtimes usually 8-9 AM for breakfast, 12 PM-2 PM for lunch and usually 7 PM for dinner  Download of his CGM was reviewed and interpreted as follows  Overnight blood sugars are relatively higher in the upper 100s but usually start coming  down by 5-6 AM with some variability but no hypoglycemia Premeal blood sugars are also variable at lunch and dinner and generally higher before dinner, averages discussed below Generally postprandial readings are on an average not rising excessively Blood sugars after breakfast are relatively flat but occasionally low normal or slightly low especially 9-10 AM Blood sugars after lunch are not rising excessively but may be occasionally low normal Blood sugars after dinner are quite variable with some episodes of significant hyperglycemia but also some low sugars by 1-2 hours Hypoglycemia during the day mostly related to post bolus event but usually not significant   CGM use % of time   2-week average/GV 144/30  Time in range       79% was 75  % Time Above 180 17  % Time above 250 2  % Time Below 70 2     PRE-MEAL Fasting Lunch Dinner Bedtime Overall  Glucose range:       Averages:    163/168    POST-MEAL PC Breakfast PC Lunch PC Dinner  Glucose range:     Averages: 124 135 156     Previously:  CGM use % of time 89  2-week average/GV 149/33  Time in range      75%  % Time Above 180 19  % Time above 250 4  % Time Below 70 2     PRE-MEAL Fasting  Lunch Dinner Bedtime Overall  Glucose range:       Averages: 134 137 148 193/191    POST-MEAL PC Breakfast PC Lunch PC Dinner  Glucose range:     Averages: 126 163 139     Hypoglycemia: With hypoglycemia he gets symptoms of confusion, feeling sleepy, anxious and at night will wake up with strange dreams and anxiety. Recently most symptoms are is related to anxiety Blood sugars may be in the 40s or 50s when he has a low sugar, currently not having a glucagon kit but had it in the past   Wt Readings from Last 3 Encounters:  10/31/22 200 lb (90.7 kg)  01/17/22 196 lb 6.4 oz (89.1 kg)  05/25/21 197 lb (89.4 kg)    Lab Results  Component Value Date   HGBA1C 6.3 10/17/2022   HGBA1C 6.8 (H) 01/03/2022   HGBA1C 6.7 (H) 05/22/2021    Lab Results  Component Value Date   MICROALBUR <0.7 01/03/2022   LDLCALC 99 05/22/2021   CREATININE 0.97 05/22/2021     Allergies as of 10/31/2022   No Known Allergies      Medication List        Accurate as of October 31, 2022 11:25 AM. If you have any questions, ask your nurse or doctor.          acetaminophen 325 MG tablet Commonly known as: TYLENOL Take 650 mg by mouth every 6 (six) hours as needed. Reported on 11/09/2015   folic acid 1 MG tablet Commonly known as: FOLVITE Take 1 mg by mouth daily.   glucose blood test strip Check blood sugar 8 times a day   Guardian 4 Glucose Sensor Misc 1 Device by Does not apply route as directed.   Guardian 4 Transmitter Misc 1 Device by Does not apply route as directed.   insulin glargine 100 UNIT/ML injection Commonly known as: Lantus USE AS DIRECTED UP TO 55 UNITS DAILY   Lyumjev 100 UNIT/ML Soln Generic drug: Insulin Lispro-aabc INJECT A MAX OF 85 UNITS DAILY INTO PUMP   multivitamin tablet Take 1 tablet by mouth daily.   OneTouch Delica Lancets 33G Misc Check blood sugar 8 times a day   OneTouch Verio w/Device Kit Check blood sugar 8 times a day   pravastatin 40 MG tablet Commonly known as: PRAVACHOL Take 1 tablet (40 mg total) by mouth daily.        Allergies: No Known Allergies  Past Medical History:  Diagnosis Date   Cancer (HCC)    Diabetes mellitus     No past surgical history on file.  Family History  Problem Relation Age of Onset   Diabetes Paternal Aunt    Heart disease Paternal Grandfather     Social History:  reports that he has never smoked. He has never used smokeless tobacco. He reports that he does not drink alcohol. No history on file for drug use.  REVIEW of systems:   HYPERLIPIDEMIA: Baseline LDL has been below 100 Has been on pravastatin for cardiovascular protection LDL has been consistently controlled   Lab Results  Component Value Date   CHOL 168 05/22/2021    HDL 59.50 05/22/2021   LDLCALC 99 05/22/2021   LDLDIRECT 100.0 12/18/2013   TRIG 45.0 05/22/2021   CHOLHDL 3 05/22/2021   He has no numbness or tingling in his feet, previously had symptoms that started after his chemotherapy several years ago   Diabetic foot in 7/24 was normal  No evidence of retinopathy  on exams done annually  No history of microalbuminuria  He has been reluctant to take the COVID-vaccine Also he is reluctant to take the influenza vaccine  Blood pressure has been ok  BP Readings from Last 3 Encounters:  10/31/22 117/72  01/17/22 110/60  05/25/21 122/82   A few months ago he had an episode of right upper quadrant pain after eating heavy higher fat meals but he did not report this to PCP, no recurrence, has not had any gallbladder ultrasounds  EXAM:  BP 117/72 (BP Location: Left Arm, Patient Position: Sitting)   Pulse 65   Ht 6' 1.75" (1.873 m)   Wt 200 lb (90.7 kg)   SpO2 97%   BMI 25.85 kg/m   Diabetic Foot Exam - Simple   Simple Foot Form Diabetic Foot exam was performed with the following findings: Yes   Visual Inspection No deformities, no ulcerations, no other skin breakdown bilaterally: Yes Sensation Testing Intact to touch and monofilament testing bilaterally: Yes Pulse Check Posterior Tibialis and Dorsalis pulse intact bilaterally: Yes Comments      ASSESSMENT:  Type 1 diabetes, on Medtronic 780 insulin pump, usually well controlled  See history of present illness for detailed discussion of current diabetes management, blood sugar patterns and problems identified  Insulin pump management and CGM analysis was reviewed in detail and abnormal patterns discussed with patient  A1c is 6.3  His hypoglycemia has improved and overall he is satisfied with the 780 pump Smart guard currently active 90% of the time He is very consistent with boluses and taking correction doses However still tending to have relatively low readings after  breakfast especially more active Also has difficulty adequately covering higher fat meals   LIPIDS: Will recheck labs today  Will also need urine microalbumin today  Recommendations: For now continue Lyumjev but if he needs longer acting insulin may need to go back to NovoLog, he prefers to stay on Lyumjev now If eating a higher carbohydrate/low-fat meal he can back bolus a few minutes ahead of time to prevent blood sugar spikes Reduce high fat intake Make sure he has carbohydrate snacks when he is doing more vigorous exercise as well as setting the temporary target Active insulin time should be 2 hours When he is planning to be more active at work especially after breakfast he can reduce his bolus by at least 1 unit  Follow-up in 6 months and keep regular appointments    Total visit time for evaluation and management and counseling = 30 minutes  There are no Patient Instructions on file for this visit.    Reather Littler  Note: This office note was prepared with Insurance underwriter. Any transcriptional errors that result from this process are unintentional.

## 2022-11-05 ENCOUNTER — Encounter: Payer: Self-pay | Admitting: Endocrinology

## 2022-11-23 ENCOUNTER — Other Ambulatory Visit: Payer: Self-pay

## 2022-11-23 DIAGNOSIS — E1065 Type 1 diabetes mellitus with hyperglycemia: Secondary | ICD-10-CM

## 2022-11-23 MED ORDER — LYUMJEV 100 UNIT/ML IJ SOLN
INTRAMUSCULAR | 1 refills | Status: DC
Start: 2022-11-23 — End: 2023-06-24

## 2022-12-18 ENCOUNTER — Encounter: Payer: Self-pay | Admitting: Internal Medicine

## 2022-12-18 LAB — HM DIABETES EYE EXAM

## 2023-01-04 ENCOUNTER — Telehealth: Payer: Self-pay | Admitting: Internal Medicine

## 2023-01-04 DIAGNOSIS — E1065 Type 1 diabetes mellitus with hyperglycemia: Secondary | ICD-10-CM

## 2023-01-04 MED ORDER — GLUCOSE BLOOD VI STRP
ORAL_STRIP | 3 refills | Status: DC
Start: 2023-01-04 — End: 2024-01-17

## 2023-01-04 NOTE — Telephone Encounter (Signed)
Refill sent.  Requested Prescriptions   Signed Prescriptions Disp Refills   glucose blood test strip 800 each 3    Sig: Check blood sugar 8 times a day    Authorizing Provider: Carlus Pavlov    Ordering User: Pollie Meyer

## 2023-01-04 NOTE — Telephone Encounter (Signed)
Patient advising he has been waiting on his test strips and is having problems getting his Verio one touch strips. Call into [facility] ASAP he is completely out . Please call patient when done.

## 2023-04-12 ENCOUNTER — Telehealth: Payer: Self-pay

## 2023-04-12 NOTE — Telephone Encounter (Signed)
Pharmacy Patient Advocate Encounter  Received notification from [employer] that Prior Authorization for Guardian 4 glucose sensor/transmitter has been APPROVED from 04/08/23 to 04/06/2024

## 2023-05-06 NOTE — Progress Notes (Deleted)
 Patient ID: Corey Hudson, male   DOB: 10/12/1965, 58 y.o.   MRN: [id number]  HPI: Corey Hudson is a 58 y.o.-year-old male, referred by his PCP, Dr. Shepard, for management of DM1, diagnosed in ***, uncontrolled, without long term complications.  Reviewed HbA1c levels: Lab Results  Component Value Date   HGBA1C 6.3 10/17/2022   HGBA1C 6.8 (H) 01/03/2022   HGBA1C 6.7 (H) 05/22/2021   Insulin  pump:  -***  CGM: -***  Insulin : - Lyumjev   Supplies: -***  Pump settings: - basal rates: 12 am: *** units/h - ICR: - target: - ISF: - Insulin  on Board: - bolus wizard: on - extended bolusing: not using - changes infusion site: q3 days TDD from basal insulin : *** TDD from bolus insulin : *** Total daily dose: ***  Meter: ***  Pt checks his sugars *** a day and they are: - am:  - 2h after b'fast: - before lunch: - 2h after lunch: - before dinner: - 2h after dinner: - bedtime: - nighttime: Lowest sugar was ***; he has hypoglycemia awareness at 70.  *** a glucagon  kit at home. No previous hypoglycemia admission.  Highest sugar was ***. No previous DKA admissions.    Pt's meals are: - Breakfast: - Lunch: - Dinner: - Snacks:  - no CKD, last BUN/creatinine:  Lab Results  Component Value Date   BUN 14 05/22/2021   BUN 17 12/15/2020   CREATININE 0.97 05/22/2021   CREATININE 1.06 12/15/2020   Lab Results  Component Value Date   MICRALBCREAT 0.6 10/31/2022   MICRALBCREAT 0.6 01/03/2022   MICRALBCREAT 0.9 12/15/2020   MICRALBCREAT 0.7 11/30/2019   MICRALBCREAT 0.6 10/20/2018   MICRALBCREAT 0.7 01/03/2018   MICRALBCREAT 0.9 08/23/2016   MICRALBCREAT 0.9 10/31/2015   MICRALBCREAT 0.6 08/08/2015   MICRALBCREAT 0.7 11/01/2014   -+ HL; last set of lipids: Lab Results  Component Value Date   CHOL 168 05/22/2021   HDL 59.50 05/22/2021   LDLCALC 99 05/22/2021   LDLDIRECT 100.0 12/18/2013   TRIG 45.0 05/22/2021   CHOLHDL 3 05/22/2021  He is on pravastatin   40 mg daily.  - last eye exam was on 12/18/2022: No DR.   - no numbness and tingling in his feet.  Last foot exam was on 10/31/2022.  Last TSH: No results found for: TSH  Pt has FH of DM in ***.  Also has a history of HTN.  ROS: Constitutional: no weight gain, no weight loss, no fatigue, no subjective hyperthermia, no subjective hypothermia, no nocturia Eyes: no blurry vision, no xerophthalmia ENT: no sore throat, no nodules palpated in neck, no dysphagia, no odynophagia, no hoarseness, no tinnitus, no hypoacusis Cardiovascular: no CP, no SOB, no palpitations, no leg swelling Respiratory: no cough, no SOB, no wheezing Gastrointestinal: no N, no V, no D, no C, no acid reflux Musculoskeletal: no muscle, no joint aches Skin: no rash, no hair loss Neurological: no tremors, no numbness or tingling/no dizziness/no HAs Psychiatric: no depression, no anxiety  Past Medical History:  Diagnosis Date   Cancer (HCC)    Diabetes mellitus    No past surgical history on file. Social History   Socioeconomic History   Marital status: Married    Spouse name: Not on file   Number of children: Not on file   Years of education: Not on file   Highest education level: Not on file  Occupational History   Not on file  Tobacco Use   Smoking status: Never   Smokeless tobacco:  Never  Substance and Sexual Activity   Alcohol use: No   Drug use: Not on file   Sexual activity: Not on file  Other Topics Concern   Not on file  Social History Narrative   Not on file   Social Drivers of Health   Financial Resource Strain: Not on file  Food Insecurity: Not on file  Transportation Needs: Not on file  Physical Activity: Not on file  Stress: Not on file  Social Connections: Not on file  Intimate Partner Violence: Not on file   Current Outpatient Medications on File Prior to Visit  Medication Sig Dispense Refill   acetaminophen (TYLENOL) 325 MG tablet Take 650 mg by mouth every 6 (six) hours  as needed. Reported on 11/09/2015     Blood Glucose Monitoring Suppl (ONETOUCH VERIO) w/Device KIT Check blood sugar 8 times a day 1 kit 0   Continuous Glucose Sensor (GUARDIAN 4 GLUCOSE SENSOR) MISC 1 Device by Does not apply route as directed. 15 each 3   Continuous Glucose Transmitter (GUARDIAN 4 TRANSMITTER) MISC 1 Device by Does not apply route as directed. 3 each 1   folic acid (FOLVITE) 1 MG tablet Take 1 mg by mouth daily.     glucose blood test strip Check blood sugar 8 times a day 800 each 3   insulin  glargine (LANTUS ) 100 UNIT/ML injection USE AS DIRECTED UP TO 55 UNITS DAILY 20 mL 5   Insulin  Lispro-aabc (LYUMJEV ) 100 UNIT/ML SOLN INJECT A MAX OF 85 UNITS DAILY INTO PUMP 80 mL 1   Multiple Vitamin (MULTIVITAMIN) tablet Take 1 tablet by mouth daily.     OneTouch Delica Lancets 33G MISC Check blood sugar 8 times a day 300 each 0   pravastatin  (PRAVACHOL ) 40 MG tablet Take 1 tablet (40 mg total) by mouth daily. 90 tablet 1   No current facility-administered medications on file prior to visit.   No Known Allergies Family History  Problem Relation Age of Onset   Diabetes Paternal Aunt    Heart disease Paternal Grandfather     PE: There were no vitals taken for this visit. Wt Readings from Last 3 Encounters:  10/31/22 200 lb (90.7 kg)  01/17/22 196 lb 6.4 oz (89.1 kg)  05/25/21 197 lb (89.4 kg)   Constitutional: overweight, in NAD Eyes:  EOMI, no exophthalmos ENT: no neck masses, no cervical lymphadenopathy Cardiovascular: RRR, No MRG Respiratory: CTA B Musculoskeletal: no deformities Skin:no rashes Neurological: no tremor with outstretched hands  ASSESSMENT: 1. DM1, uncontrolled, without long-term complications, but with hyperglycemia  2. HL  PLAN:  1. Patient with long-standing, uncontrolled DM1. - I suggested to: There are no Patient Instructions on file for this visit. - continue checking sugars at different times of the day - check at least 4 times a day,  rotating checks - given foot care handout and explained the principles  - given instructions for hypoglycemia management 15-15 rule  - advised for yearly eye exams - sent glucagon  kit Rx to pharmacy - advised to get ketone strips - advised to always have Glu tablets with him - advised for a Med-alert bracelet mentioning type 1 diabetes mellitus. - given instruction Re: exercising and driving in DM1 (pt instructions) - also, given information about sick day rules - no signs of other autoimmune disorders - Return to clinic in 3-4 mo   2. HL - Reviewed latest lipid panel from 2 years ago: Lab Results  Component Value Date   CHOL 168 05/22/2021  HDL 59.50 05/22/2021   LDLCALC 99 05/22/2021   LDLDIRECT 100.0 12/18/2013   TRIG 45.0 05/22/2021   CHOLHDL 3 05/22/2021  - Continues Pravastatin  40 mg daily without side effects.   Lela Fendt, MD PhD [facility]

## 2023-05-07 ENCOUNTER — Ambulatory Visit: Payer: Managed Care, Other (non HMO) | Admitting: Internal Medicine

## 2023-05-24 ENCOUNTER — Ambulatory Visit: Payer: Managed Care, Other (non HMO) | Admitting: Internal Medicine

## 2023-05-24 ENCOUNTER — Encounter: Payer: Self-pay | Admitting: Internal Medicine

## 2023-05-24 VITALS — BP 120/70 | HR 82 | Ht 73.75 in | Wt 206.8 lb

## 2023-05-24 DIAGNOSIS — E1065 Type 1 diabetes mellitus with hyperglycemia: Secondary | ICD-10-CM

## 2023-05-24 DIAGNOSIS — E7800 Pure hypercholesterolemia, unspecified: Secondary | ICD-10-CM

## 2023-05-24 LAB — POCT GLYCOSYLATED HEMOGLOBIN (HGB A1C): Hemoglobin A1C: 6.4 % — AB (ref 4.0–5.6)

## 2023-05-24 MED ORDER — GLUCAGON 3 MG/DOSE NA POWD: 3.0000 mg | Freq: Once | NASAL | 11 refills | Status: AC | PRN

## 2023-05-24 NOTE — Patient Instructions (Addendum)
Please use the following pump settings: - basal rates: 12 am: 1.25 units/h 2:30 am: 0.900 >> 1.20 5 am: 0.650 >> 1.00 10 am: 0.600 >> 1.00 7:30 am:  0.750 >> 1.10 - ICR:  12 am: 10  10 am: 12 5 pm: 14 9 pm: 12 - target: 100-110 - ISF:  12 am: 50 7 am: 40 - Insulin on Board: 2h   Please return in 3-4 months.  Basic Rules for Patients with Type I Diabetes Mellitus  The American Diabetes Association (ADA) recommended targets: - fasting sugar 80-130 - after meal sugar <180 - HbA1C <7%  Engage in >=150 min moderate exercise per week  Make sure you have >=8h of sleep every night as this helps both blood sugars and your weight.  Always keep a sugar log (not only record in your meter) and bring it to all appointments with [place].  If you are on a pump, know how to access the settings and to modify the parameters.   Remember, you can always call the number on the back of the pump for emergencies related to the pump.  "15-15 rule" for hypoglycemia: if sugars are low, take 15 g of carbs** ("fast sugar" - e.g. 4 glucose tablets, 4 oz orange juice), wait 15 min, then check sugars again. If still <80, repeat. Continue  until your sugars >80, then eat a normal meal.   Teach family members and coworkers to inject glucagon. Have a glucagon set at home and one at work. They should call 911 after using the set.  If you are on a pump, set "insulin on board" time for 5 hours (if your sugars tend to be higher, can use 4 hours).   If you are on a pump, use the "dual wave bolus" setting for high fat foods (e.g. pizza). Start with a setting of 50%-50% (50% instant bolus and 50% prolonged bolus over 3h, for e.g.).    If you are on a pump, make sure the basal daily insulin dose is approximately equal (not larger) to the daily insulin you get from boluses, otherwise you are at risk for hypoglycemia.  Check sugar before driving. If <100, correct, and only start driving if sugars rise >=130. Check  sugar every hour when on a long drive.  Check sugar before exercising. If <100, correct, and only start exercising if sugars rise >=100. Check sugar every hour when on a long exercise routine and 1h after you finished exercising.   If >250, check urine for ketones. If you have moderate-large ketones in urine, do not start exercise. Hydrate yourself with clear liquids and correct the high sugar. Recheck sugars and ketones before attempting to exercise.  Be aware that you might need less insulin when exercising.  *intense, short, exercise bursts can increase your sugars, but  *less intense, longer (>1h), exercise routines can decrease your sugars.  If you are on a pump, you might need to decrease your basal rate by 10% or more (or even disconnect your pump) while you exercise to prevent low sugars. Do not disconnect your pump by more than 3 hours at a time! You also might need to decrease your insulin bolus for the meal prior to your exercise time by 20% or more.  Make sure you have a MedAlert bracelet or pendant mentioning "Type I Diabetes Mellitus". If you have a prior episode of severe hypoglycemia or hypoglycemia unawareness, it should also mention this.  Please do not walk barefoot. Inspect your feet for sores/cuts  and let us know if you have them.  **E.g. of "fast carbs": first choice (15 g):  1 tube glucose gel, GlucoPouch 15, 2 oz glucose liquid second choice (15-16 g):  3 or 4 glucose tablets (best taken  with water), 15 Dextrose Bits chewable third choice (15-20 g):   cup fruit juice,  cup regular soda, 1 cup skim milk,  1 cup sports drink fourth choice (15-20 g):  1 small tube Cak[place]ate gel (not frosting), 2 tbsp raisins, 1 tbsp table sugar,  candy, jelly beans, gum drops - check package for carb amount   (adapted from: Juluis Rainier. "Insulin therapy and hypoglycemia" Endocrinol Metab Clin N Am 2012, 41: 57-87)  Sick Day Rules for Diabetes  Think  S-K-I-L-L:  Sugars:  - if glucose >200, check every 3h and drink sugar free liquids  - if glucose <200, drink carb-containing liquids and recheck 30 min later  - if glucose high, correct with insulin  - if sugars <60, initiate hypoglycemia management (take 15 g of fast carbs and check sugars in 15 min  - repeat until sugars remain >100).  Ketones:  When to check ketones?  When glucose >300 x2 if on insulin injections (>300 x 1 if on insulin pump). When nausea, vomiting, diarrhea, abdominal pain, headache, fever - even if glucose is normal or low - because in this case, you need both glucose and insulin.    - if you have ketone strips for blood >> if ketones are more or equal than 0.6, need to increase insulin - if you have ketone strips for urine >> if ketones are more or equal than "small", need to increase insulin  Insulin: Never skip long acting insulin, even if not eating!   Urine ketones Blood ketones Extra insulin?  no <0.6 no  small 0.6-1.5 Increase dose by 5%  moderate 1.5-3 Increase dose by 10%  large >3 Increase dose by at least 20%   Liquids: - if glucose >200, check every 3h and drink sugar free liquids  - if glucose <200, small sips of carb-containing liquids (e.g. Ginger ale, Gatorade, juice, etc.)  Let us know!   Call us if: Go to ED if: Call your primary care doctor if:  Sugars >300 for >8h Severe abdominal pain Fever >100F for 24h  Moderate to large  urine ketones or blood ketones >1.5 Difficulty breathing Other chronic diseases flaring up  Vomiting and unable to keep liquids down Signs of dehydration

## 2023-05-24 NOTE — Progress Notes (Addendum)
Patient ID: Corey Hudson, male   DOB: Jun 17, 1965, 58 y.o.   MRN: [id number] This note was precharted 05/07/2023.  HPI: Corey Hudson is a 58 y.o.-year-old very pleasant male, referred by his PCP, Dr. Jacky Kindle, for management of DM1, diagnosed in 1979, uncontrolled, without long term complications.  He previously saw Dr. Lucianne Muss but switched to seeing me after Dr. Lucianne Muss retired.  He had a URI last week >> sugars were higher.  He is now feeling much better.  Reviewed HbA1c levels: Lab Results  Component Value Date   HGBA1C 6.3 10/17/2022   HGBA1C 6.8 (H) 01/03/2022   HGBA1C 6.7 (H) 05/22/2021   Insulin pump:  -since 2008 -Medtronic 780G - since 2023 (he believes)  CGM: - Guardian 4  Insulin: - has Lantus backup - prev. Novolog - Lyumjev  Supplies: - [employer]  Pump settings: - basal rates: 12 am: 1.25 units/h 2:30 am: 0.900 5 am: 0.650 10 am: 0.600 7:30 am:  0.750 - ICR:  12 am: 10  10 am: 12 5 pm: 14 9 pm: 12 - target: 100-110 - ISF:  12 am: 50 7 am: 40 - Insulin on Board: 2h  - bolus wizard: on - extended bolusing: not using - changes infusion site: q3 days TDD from basal insulin: 30% (17.7 units) TDD from bolus insulin: 70% (40.8 units) Total daily dose: 59-90 units a day  Meter: One Touch Verio  Pt checks his sugars >4x a day and they are:  Lowest sugar was 20s (before the pump) >> .Marland Kitchen.60s; he has hypoglycemia awareness 50.  He does not have a glucagon kit at home. He had previous hypoglycemia ED visits x2 - before pump. Highest sugar was 300s. No previous DKA admissions.    Pt's meals are: - Breakfast: egg biscuit + coffee  - Lunch: eating out every day - trying to eat healthy - Dinner: - Snacks:  - no CKD, last BUN/creatinine:  Lab Results  Component Value Date   BUN 14 05/22/2021   BUN 17 12/15/2020   CREATININE 0.97 05/22/2021   CREATININE 1.06 12/15/2020   Lab Results  Component Value Date   MICRALBCREAT 0.6 10/31/2022    MICRALBCREAT 0.6 01/03/2022   MICRALBCREAT 0.9 12/15/2020   MICRALBCREAT 0.7 11/30/2019   MICRALBCREAT 0.6 10/20/2018   MICRALBCREAT 0.7 01/03/2018   MICRALBCREAT 0.9 08/23/2016   MICRALBCREAT 0.9 10/31/2015   MICRALBCREAT 0.6 08/08/2015   MICRALBCREAT 0.7 11/01/2014   -+ HL; last set of lipids: Lab Results  Component Value Date   CHOL 168 05/22/2021   HDL 59.50 05/22/2021   LDLCALC 99 05/22/2021   LDLDIRECT 100.0 12/18/2013   TRIG 45.0 05/22/2021   CHOLHDL 3 05/22/2021  On pravastatin 40 mg daily.  - last eye exam was on 12/18/2022: No DR.   - no numbness and tingling in his feet.  Last foot exam was on 10/31/2022.  Last TSH: No results found for: "TSH"  Pt has FH of DM in paternal aunt.  Also has a history of HTN, left inguinal hernia, ITP, non-Hodgkin lymphoma- s/p ChTx.  He has not been eating and tingling in extremities from chemotherapy.  ROS: Constitutional: no weight gain, no weight loss, no fatigue, no subjective hyperthermia, no subjective hypothermia, occasional 1X nocturia Eyes: no blurry vision, no xerophthalmia ENT: no sore throat, no nodules palpated in neck, no dysphagia, no odynophagia, no hoarseness, no tinnitus, no hypoacusis Cardiovascular: no CP, no SOB, no palpitations, no leg swelling Respiratory: no cough, no SOB, no wheezing  Gastrointestinal: no N, no V, no D, no C, no acid reflux Musculoskeletal: no muscle, no joint aches Skin: no rash, no hair loss Neurological: no tremors, no numbness or tingling/no dizziness/no HAs Psychiatric: no depression, no anxiety  Past Medical History:  Diagnosis Date   Cancer (HCC)    Diabetes mellitus    No past surgical history on file. Social History   Socioeconomic History   Marital status: Married    Spouse name: Not on file   Number of children: Not on file   Years of education: Not on file   Highest education level: Not on file  Occupational History   Not on file  Tobacco Use   Smoking status:  Never   Smokeless tobacco: Never  Substance and Sexual Activity   Alcohol use: No   Drug use: Not on file   Sexual activity: Not on file  Other Topics Concern   Not on file  Social History Narrative   Not on file   Social Drivers of Health   Financial Resource Strain: Not on file  Food Insecurity: Low Risk  (05/22/2023)   Received from [facility]   Hunger Vital Sign    Worried About Running Out of Food in the Last Year: Never true    Ran Out of Food in the Last Year: Never true  Transportation Needs: No Transportation Needs (05/22/2023)   Received from [facility]    In the past 12 months, has lack of reliable transportation kept you from medical appointments, meetings, work or from getting things needed for daily living? : No  Physical Activity: Not on file  Stress: Not on file  Social Connections: Not on file  Intimate Partner Violence: Not on file   Current Outpatient Medications on File Prior to Visit  Medication Sig Dispense Refill   acetaminophen (TYLENOL) 325 MG tablet Take 650 mg by mouth every 6 (six) hours as needed. Reported on 11/09/2015     Blood Glucose Monitoring Suppl (ONETOUCH VERIO) w/Device KIT Check blood sugar 8 times a day 1 kit 0   Continuous Glucose Sensor (GUARDIAN 4 GLUCOSE SENSOR) MISC 1 Device by Does not apply route as directed. 15 each 3   Continuous Glucose Transmitter (GUARDIAN 4 TRANSMITTER) MISC 1 Device by Does not apply route as directed. 3 each 1   folic acid (FOLVITE) 1 MG tablet Take 1 mg by mouth daily.     glucose blood test strip Check blood sugar 8 times a day 800 each 3   insulin glargine (LANTUS) 100 UNIT/ML injection USE AS DIRECTED UP TO 55 UNITS DAILY 20 mL 5   Insulin Lispro-aabc (LYUMJEV) 100 UNIT/ML SOLN INJECT A MAX OF 85 UNITS DAILY INTO PUMP 80 mL 1   Multiple Vitamin (MULTIVITAMIN) tablet Take 1 tablet by mouth daily.     OneTouch Delica Lancets 33G MISC Check blood sugar 8 times a day 300 each 0    pravastatin (PRAVACHOL) 40 MG tablet Take 1 tablet (40 mg total) by mouth daily. 90 tablet 1   No current facility-administered medications on file prior to visit.   No Known Allergies Family History  Problem Relation Age of Onset   Diabetes Paternal Aunt    Heart disease Paternal Grandfather    PE: BP 120/70   Pulse 82   Ht 6' 1.75" (1.873 m)   Wt 206 lb 12.8 oz (93.8 kg)   SpO2 96%   BMI 26.73 kg/m  Wt Readings from Last 3  Encounters:  05/24/23 206 lb 12.8 oz (93.8 kg)  10/31/22 200 lb (90.7 kg)  01/17/22 196 lb 6.4 oz (89.1 kg)   Constitutional: overweight, in NAD Eyes:  EOMI, no exophthalmos ENT: no neck masses, no cervical lymphadenopathy Cardiovascular: RRR, No MRG Respiratory: CTA B Musculoskeletal: no deformities Skin:no rashes Neurological: no tremor with outstretched hands  ASSESSMENT: 1. DM1, controlled, without long-term complications, but with hyperglycemia  2. HL  PLAN:  1. Patient with long-standing, controlled DM1, diagnosed at 58 years old (he had diabetes for 45 years without long-term complications!), on insulin pump therapy for the last 17 years.  He is currently on the Medtronic insulin pump with integrated CGM.  He likes the pump and feels that his blood sugars are much worse when he has to be off the pump.  HbA1c is slightly higher: 6.4%. CGM interpretation: -At today's visit, we reviewed his CGM downloads: It appears that 80% of values are in target range (goal >70%), while 19% are higher than 180 (goal <25%), and 1% are lower than 70 (goal <4%).  The calculated average blood sugar is 150.  The projected HbA1c for the next 3 months (GMI) is 6.9%. -Reviewing the CGM trends, sugars appear to be fluctuating within the target range, with slight increase in blood sugars after meals and also in the first half of the night but improvement in blood sugars in the second half of the night in between meals.  He does mention that his sugars have been higher in the  last week due to upper respiratory infection.  Overall, however, even with these mild hyperglycemic episodes, the sugars still appear to be well-controlled.  Reviewing individual pump downloads, it appears that he is introducing many carbs into the pump, including fat and carbs.  Upon questioning, he is having hard time bolusing repeatedly during the day to make sure that the sugars stay controlled.  He is getting 70% of the daily insulin dose and only 30% from the basal.  At today's visit, I suggested an increase in her basal rates from boluses and we discussed that these are mostly impacting hi while in the manual mode, but even in the auto mode, these will serve as a starting point for adjustment.  I do not feel we need to change his insulin to carb ratios or the rest of the pump parameters for now.  He does have a short acting insulin time, of 2 hours, which works well for him. -He is not having an ideal diet, eating out ~ twice a day, but he is trying to make good choices.  I did advise him that if he ate a low-carb high-protein meal, he may need to enter approximately 50% of the protein amount in grams in the pump as carbs. - I suggested to: Patient Instructions  Please use the following pump settings: - basal rates: 12 am: 1.25 units/h 2:30 am: 0.900 >> 1.20 5 am: 0.650 >> 1.00 10 am: 0.600 >> 1.00 7:30 am:  0.750 >> 1.10 - ICR:  12 am: 10  10 am: 12 5 pm: 14 9 pm: 12 - target: 100-110 - ISF:  12 am: 50 7 am: 40 - Insulin on Board: 2h   Please return in 3-4 months.   - advised him to check sugars at different times of the day - check at least 4 times a day, rotating checks - given foot care handout and explained the principles  - given instructions for hypoglycemia management "15-15 rule"  -  advised for yearly eye exams - sent a Baqsimi Rx to pharmacy as he does not have glucagon at home.  I explained how this is used. - advised to get ketone strips - advised to always have Glu  tablets with him - advised for a Med-alert bracelet mentioning "type 1 diabetes mellitus". - given instruction Re: exercising and driving in DM1 (pt instructions) - also, given information about sick day rules - no signs of other autoimmune disorders - he is due for a BMP and a lipid panel.  He would like to wait until next visit with PCP, which is coming up, to have these done. - Return to clinic in 3-4 months  2. HL - Reviewed latest lipid panel from 2 years ago: Lab Results  Component Value Date   CHOL 168 05/22/2021   HDL 59.50 05/22/2021   LDLCALC 99 05/22/2021   LDLDIRECT 100.0 12/18/2013   TRIG 45.0 05/22/2021   CHOLHDL 3 05/22/2021  -Continues Pravastatin 40 mg daily without side effects. -He is due for another lipid panel -pending at the time of his PCP appointment  - Total time spent for the visit: 40 min (outside CGM interpretation), in precharting, postcharting, reviewing Dr. Remus Blake last note, obtaining medical information from the chart and from the pt, reviewing his  previous labs, evaluations, and treatments, reviewing his symptoms, counseling him about his diabetes (please see the discussed topics above), and developing a plan to further avoid hyper and hypoglycemia; he had a number of questions which I addressed.  Carlus Pavlov, MD PhD [facility]

## 2023-05-24 NOTE — Addendum Note (Signed)
Addended by: Carlus Pavlov on: 05/24/2023 02:27 PM   Modules accepted: Level of Service

## 2023-06-11 ENCOUNTER — Telehealth: Payer: Self-pay | Admitting: Nutrition

## 2023-06-11 DIAGNOSIS — E1065 Type 1 diabetes mellitus with hyperglycemia: Secondary | ICD-10-CM

## 2023-06-11 MED ORDER — PRAVASTATIN SODIUM 40 MG PO TABS
40.0000 mg | ORAL_TABLET | Freq: Every day | ORAL | 1 refills | Status: AC
Start: 2023-06-11 — End: ?

## 2023-06-11 NOTE — Telephone Encounter (Signed)
Requested Prescriptions   Signed Prescriptions Disp Refills   pravastatin (PRAVACHOL) 40 MG tablet 90 tablet 1    Sig: Take 1 tablet (40 mg total) by mouth daily.    Authorizing Provider: Carlus Pavlov    Ordering User: Pollie Meyer

## 2023-06-11 NOTE — Telephone Encounter (Signed)
Patient left a voice mail on my phone that his pharmacy[facility] has called Dr. Charlean Sanfilippo office for a refill on his prevastatin.  He is out.  Thank you

## 2023-06-24 ENCOUNTER — Telehealth: Payer: Self-pay

## 2023-06-24 DIAGNOSIS — E1065 Type 1 diabetes mellitus with hyperglycemia: Secondary | ICD-10-CM

## 2023-06-24 MED ORDER — LYUMJEV 100 UNIT/ML IJ SOLN
INTRAMUSCULAR | 3 refills | Status: DC
Start: 2023-06-24 — End: 2024-01-13

## 2023-06-24 NOTE — Telephone Encounter (Signed)
 Requested Prescriptions   Signed Prescriptions Disp Refills   Insulin Lispro-aabc (LYUMJEV) 100 UNIT/ML SOLN 80 mL 3    Sig: INJECT A MAX OF 85 UNITS DAILY INTO PUMP    Authorizing Provider: Carlus Pavlov    Ordering User: Pollie Meyer

## 2023-10-09 ENCOUNTER — Encounter: Payer: Self-pay | Admitting: Internal Medicine

## 2023-10-09 ENCOUNTER — Ambulatory Visit: Payer: Managed Care, Other (non HMO) | Admitting: Internal Medicine

## 2023-10-09 VITALS — BP 120/70 | HR 61 | Ht 73.75 in | Wt 207.6 lb

## 2023-10-09 DIAGNOSIS — E1065 Type 1 diabetes mellitus with hyperglycemia: Secondary | ICD-10-CM | POA: Diagnosis not present

## 2023-10-09 DIAGNOSIS — E7800 Pure hypercholesterolemia, unspecified: Secondary | ICD-10-CM

## 2023-10-09 LAB — POCT GLYCOSYLATED HEMOGLOBIN (HGB A1C): Hemoglobin A1C: 6.1 % — AB (ref 4.0–5.6)

## 2023-10-09 MED ORDER — PRAVASTATIN SODIUM 40 MG PO TABS
40.0000 mg | ORAL_TABLET | Freq: Every day | ORAL | 3 refills | Status: DC
Start: 2023-10-09 — End: 2023-11-29

## 2023-10-09 NOTE — Patient Instructions (Addendum)
 Please use the following pump settings: - basal rates: 12 am: 1.25 units/h 2:30 am: 1.2 5 am: 1.0 10 am: 1.0 7:30 am: 1.1 - ICR:  12 am: 10  10 am: 12 5 pm: 14 9 pm: 12 - target: 100-110 - ISF:  12 am: 50 7 am: 40 - Insulin  on Board: 2h   Please return in 4-6 months.

## 2023-10-09 NOTE — Progress Notes (Signed)
 Patient ID: Corey Hudson, male   DOB: [birth year], 58 y.o.   MRN: [id number] This note was precharted 05/07/2023.  HPI: Corey Hudson is a 58 y.o.-year-old very pleasant male, initially referred by his PCP, Corey Hudson, returning for follow-up for DM1, diagnosed in 1979, fairly well controlled, without long term complications.  He previously saw Corey Hudson but switched to seeing me after Corey Hudson retired.  Our first visit was 4.5 months ago.  Interim history: No increased urination, blurry vision, nausea, chest pain. He continues to stay active.  He is hiking and changing the pump to exercise mode when doing so.  However, yesterday he climbed [place] and he dropped his blood sugars to the 50s. He has a left inguinal hernia, which appears to be fairly well-controlled-he wears a belt.  Reviewed HbA1c levels: Lab Results  Component Value Date   HGBA1C 6.4 (A) 05/24/2023   HGBA1C 6.3 10/17/2022   HGBA1C 6.8 (H) 01/03/2022   Insulin  pump:  -since 2008 -Medtronic 780G - since 2023 (he believes)  CGM: - Guardian 4  Insulin : - has Lantus  backup - prev. Novolog  - Lyumjev   Supplies: - [employer]  Pump settings: - basal rates: 12 am: 1.25 units/h 2:30 am: 0.900 >> 1.20 5 am: 0.650 >> 1.00 10 am: 0.600 >> 1.00 7:30 am:  0.750 >> 1.10 - ICR:  12 am: 10  10 am: 12 5 pm: 14 9 pm: 12 - target: 100-110 - ISF:  12 am: 50 7 am: 40 - Insulin  on Board: 2h  - bolus wizard: on - extended bolusing: not using - changes infusion site: q3 days TDD from basal insulin : 30% (17.7 units) >> 42% (17 units) TDD from bolus insulin : 70% (40.8 units) >> 58% Total daily dose: 59-90 units a day Uses temporary targets: 150.  Meter: One Touch Verio  Pt checks his sugars >4x a day and they are:  Previously:  Lowest sugar was 20s (before the pump) >> .Corey Hudson.60s; he has hypoglycemia awareness 50.  He does not have a glucagon  kit at home. He had previous hypoglycemia ED visits x2 - before  pump. Highest sugar was 300s. No previous DKA admissions.    Pt's meals are: - Breakfast: egg biscuit + coffee  - Lunch: eating out every day - trying to eat healthy - Dinner: - Snacks:  - no CKD, last BUN/creatinine:  06/12/2023: 19/0.97, GFR 86.7 Lab Results  Component Value Date   BUN 14 05/22/2021   BUN 17 12/15/2020   CREATININE 0.97 05/22/2021   CREATININE 1.06 12/15/2020    Lab Results  Component Value Date   MICRALBCREAT 0.6 10/31/2022   MICRALBCREAT 0.6 01/03/2022   MICRALBCREAT 0.9 12/15/2020   MICRALBCREAT 0.7 11/30/2019   MICRALBCREAT 0.6 10/20/2018   MICRALBCREAT 0.7 01/03/2018   MICRALBCREAT 0.9 08/23/2016   MICRALBCREAT 0.9 10/31/2015   MICRALBCREAT 0.6 08/08/2015   MICRALBCREAT 0.7 11/01/2014   -+ HL; last set of lipids: 06/12/2023: 172/42/65/99 Lab Results  Component Value Date   CHOL 168 05/22/2021   HDL 59.50 05/22/2021   LDLCALC 99 05/22/2021   LDLDIRECT 100.0 12/18/2013   TRIG 45.0 05/22/2021   CHOLHDL 3 05/22/2021  On pravastatin  40 mg daily.  - last eye exam was on 12/18/2022: No DR.   - no numbness and tingling in his feet.  Last foot exam was on 10/31/2022.  Last TSH: 06/18/2023: TSH 2.54 No results found for: TSH  Pt has FH of DM in paternal aunt.  Also has a  history of HTN, left inguinal hernia, ITP, non-Hodgkin lymphoma- s/p ChTx.  He has not been eating and tingling in extremities from chemotherapy.  ROS: + see HPI  Past Medical History:  Diagnosis Date   Cancer (HCC)    Diabetes mellitus    No past surgical history on file. Social History   Socioeconomic History   Marital status: Married    Spouse name: Not on file   Number of children: Not on file   Years of education: Not on file   Highest education level: Not on file  Occupational History   Not on file  Tobacco Use   Smoking status: Never   Smokeless tobacco: Never  Substance and Sexual Activity   Alcohol use: No   Drug use: Not on file   Sexual activity:  Not on file  Other Topics Concern   Not on file  Social History Narrative   Not on file   Social Drivers of Health   Financial Resource Strain: Not on file  Food Insecurity: Low Risk  (05/22/2023)   Received from [facility]   Hunger Vital Sign    Within the past 12 months, you worried that your food would run out before you got money to buy more: Never true    Within the past 12 months, the food you bought just didn't last and you didn't have money to get more. : Never true  Transportation Needs: No Transportation Needs (05/22/2023)   Received from [facility]    In the past 12 months, has lack of reliable transportation kept you from medical appointments, meetings, work or from getting things needed for daily living? : No  Physical Activity: Not on file  Stress: Not on file  Social Connections: Not on file  Intimate Partner Violence: Not on file   Current Outpatient Medications on File Prior to Visit  Medication Sig Dispense Refill   acetaminophen (TYLENOL) 325 MG tablet Take 650 mg by mouth every 6 (six) hours as needed. Reported on 11/09/2015     Blood Glucose Monitoring Suppl (ONETOUCH VERIO) w/Device KIT Check blood sugar 8 times a day 1 kit 0   Continuous Glucose Sensor (GUARDIAN 4 GLUCOSE SENSOR) MISC 1 Device by Does not apply route as directed. 15 each 3   Continuous Glucose Transmitter (GUARDIAN 4 TRANSMITTER) MISC 1 Device by Does not apply route as directed. 3 each 1   folic acid (FOLVITE) 1 MG tablet Take 1 mg by mouth daily.     Glucagon  3 MG/DOSE POWD Place 3 mg into the nose once as needed for up to 1 dose. 1 each 11   glucose blood test strip Check blood sugar 8 times a day 800 each 3   insulin  glargine (LANTUS ) 100 UNIT/ML injection USE AS DIRECTED UP TO 55 UNITS DAILY 20 mL 5   Insulin  Lispro-aabc (LYUMJEV ) 100 UNIT/ML SOLN INJECT A MAX OF 85 UNITS DAILY INTO PUMP 80 mL 3   Multiple Vitamin (MULTIVITAMIN) tablet Take 1 tablet by mouth daily.      OneTouch Delica Lancets 33G MISC Check blood sugar 8 times a day 300 each 0   pravastatin  (PRAVACHOL ) 40 MG tablet Take 1 tablet (40 mg total) by mouth daily. 90 tablet 1   No current facility-administered medications on file prior to visit.   No Known Allergies Family History  Problem Relation Age of Onset   Diabetes Paternal Aunt    Heart disease Paternal Grandfather    PE: BP  120/70   Pulse 61   Ht 6' 1.75 (1.873 m)   Wt 207 lb 9.6 oz (94.2 kg)   SpO2 97%   BMI 26.84 kg/m  Wt Readings from Last 3 Encounters:  10/09/23 207 lb 9.6 oz (94.2 kg)  05/24/23 206 lb 12.8 oz (93.8 kg)  10/31/22 200 lb (90.7 kg)   Constitutional: overweight, in NAD Eyes:  EOMI, no exophthalmos ENT: no neck masses, no cervical lymphadenopathy Cardiovascular: RRR, No MRG Respiratory: CTA B Musculoskeletal: no deformities Skin:no rashes Neurological: no tremor with outstretched hands Diabetic Foot Exam - Simple   Simple Foot Form Diabetic Foot exam was performed with the following findings: Yes 10/09/2023  8:12 AM  Visual Inspection No deformities, no ulcerations, no other skin breakdown bilaterally: Yes Sensation Testing Intact to touch and monofilament testing bilaterally: Yes Pulse Check Posterior Tibialis and Dorsalis pulse intact bilaterally: Yes Comments    ASSESSMENT: 1. DM1, controlled, without long-term complications, but with hyperglycemia  2. HL  PLAN:  1. Patient with longstanding, fairly well-controlled, type 1 diabetes, diagnosed as 58 years old (he had diabetes for more than 45 years without long-term complications), on insulin  pump therapy for the last 17 years.  He is currently on the Medtronic insulin  pump integrated with the Medtronic Guardian CGM.  His latest HbA1c was slightly higher at 6.4%, but still at goal.  Sugars were fluctuating within the target range at last visit, with slight increase in blood sugars after meals and also in the first half of the night but  improvement in blood sugars in the second half of the night and between meals.  He did have an upper respiratory infection in the week prior to our appointment which could have elevated his blood sugars.  He was introducing many carbs into the pump, including fat and carbs.  He was having a hard time bolusing repeatedly during the day to make sure that the sugars stay controlled.  He was getting 70% of the daily insulin  dose from boluses and only 30% from the basal so I advised him to increase his basal rates.  We did not change the rest of the regimen.  He was not having an ideal diet, eating out approximately twice a day but he was trying to make good choices.  I advised him that if he ate a low-carb high-protein meal, to enter approximately 50% of the protein amount in grams into the pump, as carbs. - A1c was 6.6% in 05/2023. CGM interpretation: -At today's visit, we reviewed his CGM downloads: It appears that 87% of values are in target range (goal >70%), while 10% are higher than 180 (goal <25%), and 3% are lower than 70 (goal <4%).  The calculated average blood sugar is 132.  The projected HbA1c for the next 3 months (GMI) is 6.5%. -Reviewing the CGM trends, sugars continue to improve, with the vast majority of them at goal.  They are fluctuating within the target range with only occasional increases after dinner.  Sugars may also stay slightly higher at night, but this is not very consistent so for now I did not suggest a change in basal rates.  Also, his insulin  to carb ratios appear to be adequate.  The only issue appears to be dropping his blood sugars too low when exercising (for example hiking yesterday, when he started to exercise with a blood sugar at 205 and ended up at 52, despite sitting the target of 150 - as opposed to his regular target of  110).  We discussed that he may need to take the pump out of the auto mode when exercising and doing a temporary basal rate of 70% or maybe even lower.  He  will try this. -We will continue the same the pump settings otherwise. - I suggested to: Patient Instructions  Please use the following pump settings: - basal rates: 12 am: 1.25 units/h 2:30 am: 1.2 5 am: 1.0 10 am: 1.0 7:30 am: 1.1 - ICR:  12 am: 10  10 am: 12 5 pm: 14 9 pm: 12 - target: 100-110 - ISF:  12 am: 50 7 am: 40 - Insulin  on Board: 2h   Please return in 3-4 months.   - we checked his HbA1c: 6.1% (lower) - advised to check sugars at different times of the day - 4x a day, rotating check times - advised for yearly eye exams >> he is UTD - return to clinic in 3-4 months  2. HL - Latest lipid panel was reviewed from 4 mo ago: At goal - He continues on pravastatin  40 mg daily without side effects - refilled today  Emilie Harden, MD PhD [facility]

## 2023-11-29 ENCOUNTER — Other Ambulatory Visit: Payer: Self-pay

## 2023-11-29 DIAGNOSIS — E1065 Type 1 diabetes mellitus with hyperglycemia: Secondary | ICD-10-CM

## 2023-11-29 MED ORDER — PRAVASTATIN SODIUM 40 MG PO TABS: 40.0000 mg | ORAL_TABLET | Freq: Every day | ORAL | 3 refills | Status: AC

## 2023-12-11 LAB — HM DIABETES EYE EXAM

## 2024-01-10 ENCOUNTER — Other Ambulatory Visit: Payer: Self-pay

## 2024-01-13 ENCOUNTER — Other Ambulatory Visit: Payer: Self-pay

## 2024-01-13 DIAGNOSIS — E1065 Type 1 diabetes mellitus with hyperglycemia: Secondary | ICD-10-CM

## 2024-01-13 MED ORDER — LYUMJEV 100 UNIT/ML IJ SOLN
INTRAMUSCULAR | 3 refills | Status: AC
Start: 2024-01-13 — End: ?

## 2024-01-17 ENCOUNTER — Other Ambulatory Visit: Payer: Self-pay

## 2024-01-17 DIAGNOSIS — E1065 Type 1 diabetes mellitus with hyperglycemia: Secondary | ICD-10-CM

## 2024-01-17 MED ORDER — GLUCOSE BLOOD VI STRP: ORAL_STRIP | 3 refills | Status: DC

## 2024-04-09 ENCOUNTER — Encounter: Payer: Self-pay | Admitting: Internal Medicine

## 2024-04-09 ENCOUNTER — Ambulatory Visit: Admitting: Internal Medicine

## 2024-04-09 VITALS — BP 122/70 | HR 70 | Ht 73.75 in | Wt 204.2 lb

## 2024-04-09 DIAGNOSIS — E7800 Pure hypercholesterolemia, unspecified: Secondary | ICD-10-CM | POA: Diagnosis not present

## 2024-04-09 DIAGNOSIS — E1065 Type 1 diabetes mellitus with hyperglycemia: Secondary | ICD-10-CM

## 2024-04-09 LAB — POCT GLYCOSYLATED HEMOGLOBIN (HGB A1C): Hemoglobin A1C: 6.3 % — AB (ref 4.0–5.6)

## 2024-04-09 MED ORDER — INSULIN GLARGINE 100 UNIT/ML ~~LOC~~ SOLN: SUBCUTANEOUS | 5 refills | Status: AC

## 2024-04-09 NOTE — Patient Instructions (Addendum)
 Please use the following pump settings: - basal rates: 12 am: 1.25 units/h 2:30 am: 1.2 5 am: 1.0 10 am: 1.0 7:30 am: 1.1 - ICR:  12 am: 10  10 am: 12 5 pm: 14 >> 12 9 pm: 12 - target: 100-110 - ISF:  12 am: 50 7 am: 40 - Insulin  on Board: 2h   Please return in 4-6 months.

## 2024-04-09 NOTE — Progress Notes (Signed)
 Patient ID: Corey Hudson, male   DOB: 1966/02/15, 58 y.o.   MRN: [id number]  HPI: Corey Hudson is a 58 y.o.-year-old very pleasant male, initially referred by his PCP, Dr. Shepard, returning for follow-up for DM1, diagnosed in 1979, fairly well controlled, without long term complications.  He previously saw Dr. Von but switched to seeing me after Dr. Von retired.  Our first visit was 6 months ago.  Interim history: No increased urination, blurry vision, nausea, chest pain. Since last visit, he was not as active as before after hurting his knee.  This is now getting better.  Reviewed HbA1c levels: Lab Results  Component Value Date   HGBA1C 6.1 (A) 10/09/2023   HGBA1C 6.4 (A) 05/24/2023   HGBA1C 6.3 10/17/2022   Insulin  pump:  -since 2008 -Medtronic 780G - since 2023 (he believes)  CGM: - Guardian 4  Insulin : - has Lantus  backup - but expired - prev. Novolog  - now Lyumjev   Supplies: - [employer]  Pump settings: - basal rates: 12 am: 1.25 units/h 2:30 am: 0.900 >> 1.20 5 am: 0.650 >> 1.00 10 am: 0.600 >> 1.00 7:30 am:  0.750 >> 1.10 - ICR:  12 am: 10  10 am: 12 5 pm: 14 9 pm: 12 - target: 100-110 - ISF:  12 am: 50 7 am: 40 - Insulin  on Board: 2h  - bolus wizard: on - extended bolusing: not using - changes infusion site: q3 days TDD from basal insulin : 30% (17.7 units) >> 42% (17 units) >> 18.7 units (35%) TDD from bolus insulin : 70% (40.8 units) >> 58% >> 34.7 units (65%) Total daily dose: 59-90 >> 54-80 units a day Uses temporary target of 150.  Meter: One Touch Verio  Pt checks his sugars >4x a day and they are:  Previously:  Previously:  Lowest sugar was 20s (before the pump) >> .SABRA.60s; he has hypoglycemia awareness 50.  He does not have a glucagon  kit at home. He had previous hypoglycemia ED visits x2 - before starting on the pump. Highest sugar was 300s >> 300. No previous DKA admissions.    Pt's meals are: - Breakfast: egg biscuit +  coffee  - Lunch: eating out - trying to make healthy choices  - no CKD, last BUN/creatinine:  06/12/2023: 19/0.97, GFR 86.7 Lab Results  Component Value Date   BUN 14 05/22/2021   BUN 17 12/15/2020   CREATININE 0.97 05/22/2021   CREATININE 1.06 12/15/2020    No results found for: MICRALBCREAT  -+ HL; last set of lipids: 06/12/2023: 172/42/65/99 Lab Results  Component Value Date   CHOL 168 05/22/2021   HDL 59.50 05/22/2021   LDLCALC 99 05/22/2021   LDLDIRECT 100.0 12/18/2013   TRIG 45.0 05/22/2021   CHOLHDL 3 05/22/2021  On pravastatin  40 mg daily.  - last eye exam was on 12/11/2023: No DR.   - no numbness and tingling in his feet.  Last foot exam was on 10/09/2023.  Last TSH: 06/18/2023: TSH 2.54 No results found for: TSH  Pt has FH of DM in paternal aunt.  Also has a history of HTN, left inguinal hernia, ITP, non-Hodgkin lymphoma- s/p ChTx.  He has not been eating and tingling in extremities from chemotherapy.  ROS: + see HPI  Past Medical History:  Diagnosis Date   Cancer (HCC)    Diabetes mellitus    No past surgical history on file. Social History   Socioeconomic History   Marital status: Married    Spouse name: Not  on file   Number of children: Not on file   Years of education: Not on file   Highest education level: Not on file  Occupational History   Not on file  Tobacco Use   Smoking status: Never   Smokeless tobacco: Never  Substance and Sexual Activity   Alcohol use: No   Drug use: Not on file   Sexual activity: Not on file  Other Topics Concern   Not on file  Social History Narrative   Not on file   Social Drivers of Health   Tobacco Use: Low Risk (10/09/2023)   Patient History    Smoking Tobacco Use: Never    Smokeless Tobacco Use: Never    Passive Exposure: Not on file  Financial Resource Strain: Not on file  Food Insecurity: Low Risk (05/22/2023)   Received from [facility]   Epic    Within the past 12 months, you worried  that your food would run out before you got money to buy more: Never true    Within the past 12 months, the food you bought just didn't last and you didn't have money to get more. : Never true  Transportation Needs: No Transportation Needs (05/22/2023)   Received from [facility]    In the past 12 months, has lack of reliable transportation kept you from medical appointments, meetings, work or from getting things needed for daily living? : No  Physical Activity: Not on file  Stress: Not on file  Social Connections: Not on file  Intimate Partner Violence: Not on file  Depression (EYV7-0): Not on file  Alcohol Screen: Not on file  Housing: Low Risk (05/22/2023)   Received from [facility]   Epic    What is your living situation today?: I have a steady place to live    Think about the place you live. Do you have problems with any of the following? Choose all that apply:: None/None on this list  Utilities: Low Risk (05/22/2023)   Received from [facility]   Utilities    In the past 12 months has the electric, gas, oil, or water company threatened to shut off services in your home? : No  Health Literacy: Not on file   Current Outpatient Medications on File Prior to Visit  Medication Sig Dispense Refill   acetaminophen (TYLENOL) 325 MG tablet Take 650 mg by mouth every 6 (six) hours as needed. Reported on 11/09/2015     Blood Glucose Monitoring Suppl (ONETOUCH VERIO) w/Device KIT Check blood sugar 8 times a day 1 kit 0   Continuous Glucose Sensor (GUARDIAN 4 GLUCOSE SENSOR) MISC 1 Device by Does not apply route as directed. 15 each 3   Continuous Glucose Transmitter (GUARDIAN 4 TRANSMITTER) MISC 1 Device by Does not apply route as directed. 3 each 1   folic acid (FOLVITE) 1 MG tablet Take 1 mg by mouth daily.     Glucagon  3 MG/DOSE POWD Place 3 mg into the nose once as needed for up to 1 dose. 1 each 11   glucose blood test strip Check blood sugar 8 times a day 800 each 3    insulin  glargine (LANTUS ) 100 UNIT/ML injection USE AS DIRECTED UP TO 55 UNITS DAILY 20 mL 5   Insulin  Lispro-aabc (LYUMJEV ) 100 UNIT/ML SOLN INJECT A MAX OF 85 UNITS DAILY INTO PUMP 80 mL 3   Multiple Vitamin (MULTIVITAMIN) tablet Take 1 tablet by mouth daily.     OneTouch Delica  Lancets 33G MISC Check blood sugar 8 times a day 300 each 0   pravastatin  (PRAVACHOL ) 40 MG tablet Take 1 tablet (40 mg total) by mouth daily. 90 tablet 3   No current facility-administered medications on file prior to visit.   No Known Allergies Family History  Problem Relation Age of Onset   Diabetes Paternal Aunt    Heart disease Paternal Grandfather    PE: BP 122/70   Pulse 70   Ht 6' 1.75 (1.873 m)   Wt 204 lb 3.2 oz (92.6 kg)   SpO2 96%   BMI 26.40 kg/m  Wt Readings from Last 3 Encounters:  04/09/24 204 lb 3.2 oz (92.6 kg)  10/09/23 207 lb 9.6 oz (94.2 kg)  05/24/23 206 lb 12.8 oz (93.8 kg)   Constitutional: overweight, in NAD Eyes:  EOMI, no exophthalmos ENT: no neck masses, no cervical lymphadenopathy Cardiovascular: RRR, No MRG Respiratory: CTA B Musculoskeletal: no deformities Skin:no rashes Neurological: no tremor with outstretched hands  ASSESSMENT: 1. DM1, controlled, without long-term complications, but with hyperglycemia  2. HL  PLAN:  1. Patient with longstanding, fairly well-controlled, type 1 diabetes, diagnosed at 58 years old (he had diabetes for more than 45 years without long-term complications), on insulin  pump therapy for the last 17 years.  He is currently on the Medtronic insulin  pump integrated with a Medtronic CGM.  Latest HbA1c was improved at last visit, at 6.1%, at goal, decreased from 6.6% in 05/2023.SABRA  At that time, sugars continued to improve, with the vast majority of the values at goal.  They were fluctuating within the target range with only occasional increases in blood sugars after dinner.  Sugars were occasionally slightly higher at night but this was not  very consistent so I did not recommend a change in basal rate at that time.  Also, his insulin  to carb ratios appears to be adequate.  The only issue appears to be that he was dropping his blood sugars too low when he was exercising (for example after hiking, blood sugars dropped the day prior to the appointment from 205-52, despite setting the target higher, at 150 mg/dL as opposed to his regular target of 110.  We did discuss that he may need to take the pump out of the auto mode when exercising and doing a temporary basal rate of 70% or maybe even lower.  We did not change the pump settings otherwise. CGM interpretation: -At today's visit, we reviewed his CGM downloads: It appears that 83% of values are in target range (goal >70%), while 16% are higher than 180 (goal <25%), and 1% are lower than 70 (goal <4%).  The calculated average blood sugar is 139.  The projected HbA1c for the next 3 months (GMI) is 6.6%. -Reviewing the CGM trends, sugars appear to be higher than before, increasing after meals, particularly after dinner dinner, but also occasionally after lunch.  Sugars after breakfast, well-controlled.  They do improve slowly overnight.  He attributes the higher blood sugars to being less active due to his knee pain.  At today's visit I recommended to strengthened his insulin  to carb ratio with dinner, but we can continue the rest of the pump settings for now. - Regarding exercise, he does mention that before he started to have knee pain, he felt that his sugars were doing better during exercise, not dropping as low as before - I suggested to: Patient Instructions  Please use the following pump settings: - basal rates: 12 am: 1.25  units/h 2:30 am: 1.2 5 am: 1.0 10 am: 1.0 7:30 am: 1.1 - ICR:  12 am: 10  10 am: 12 5 pm: 14 9 pm: 12 - target: 100-110 - ISF:  12 am: 50 7 am: 40 - Insulin  on Board: 2h   Please return in 4-6 months.   - we checked his HbA1c: 6.3% - slightly higher -  advised to check sugars at different times of the day - 4x a day, rotating check times - advised for yearly eye exams >> he is UTD - return to clinic in 4-6 months  2. HL - Latest lipid panel was reviewed from earlier in the year: Fractions at goal - He continues on pravastatin  40 mg daily without side effects - He is wondering whether he absolutely needed the pravastatin  about the guideline recommendation and the reason why this is recommended especially for patients with diabetes  Lela Fendt, MD PhD [facility] Endocrinology

## 2024-10-07 ENCOUNTER — Ambulatory Visit: Admitting: Internal Medicine
